# Patient Record
Sex: Male | Born: 1940
Health system: Southern US, Community
[De-identification: ages and names within clinical notes are randomized; demographics above are authoritative.]

## PROBLEM LIST (undated history)

## (undated) DIAGNOSIS — F17210 Nicotine dependence, cigarettes, uncomplicated: Secondary | ICD-10-CM

## (undated) DIAGNOSIS — E785 Hyperlipidemia, unspecified: Secondary | ICD-10-CM

## (undated) DIAGNOSIS — L600 Ingrowing nail: Secondary | ICD-10-CM

## (undated) DIAGNOSIS — I8390 Asymptomatic varicose veins of unspecified lower extremity: Secondary | ICD-10-CM

## (undated) DIAGNOSIS — R7301 Impaired fasting glucose: Secondary | ICD-10-CM

## (undated) DIAGNOSIS — R42 Dizziness and giddiness: Secondary | ICD-10-CM

## (undated) DIAGNOSIS — Z9889 Other specified postprocedural states: Secondary | ICD-10-CM

## (undated) DIAGNOSIS — Z9989 Dependence on other enabling machines and devices: Secondary | ICD-10-CM

## (undated) DIAGNOSIS — I499 Cardiac arrhythmia, unspecified: Secondary | ICD-10-CM

## (undated) DIAGNOSIS — M1990 Unspecified osteoarthritis, unspecified site: Secondary | ICD-10-CM

## (undated) DIAGNOSIS — G4733 Obstructive sleep apnea (adult) (pediatric): Secondary | ICD-10-CM

## (undated) DIAGNOSIS — R112 Nausea with vomiting, unspecified: Secondary | ICD-10-CM

## (undated) DIAGNOSIS — E782 Mixed hyperlipidemia: Secondary | ICD-10-CM

## (undated) DIAGNOSIS — I714 Abdominal aortic aneurysm, without rupture: Secondary | ICD-10-CM

## (undated) DIAGNOSIS — Z7901 Long term (current) use of anticoagulants: Secondary | ICD-10-CM

## (undated) DIAGNOSIS — I480 Paroxysmal atrial fibrillation: Secondary | ICD-10-CM

## (undated) DIAGNOSIS — H8390 Unspecified disease of inner ear, unspecified ear: Secondary | ICD-10-CM

## (undated) DIAGNOSIS — F419 Anxiety disorder, unspecified: Secondary | ICD-10-CM

## (undated) DIAGNOSIS — K219 Gastro-esophageal reflux disease without esophagitis: Secondary | ICD-10-CM

## (undated) DIAGNOSIS — I723 Aneurysm of iliac artery: Secondary | ICD-10-CM

## (undated) DIAGNOSIS — I517 Cardiomegaly: Secondary | ICD-10-CM

## (undated) DIAGNOSIS — Z96651 Presence of right artificial knee joint: Secondary | ICD-10-CM

## (undated) DIAGNOSIS — I8392 Asymptomatic varicose veins of left lower extremity: Secondary | ICD-10-CM

## (undated) DIAGNOSIS — M25561 Pain in right knee: Secondary | ICD-10-CM

## (undated) DIAGNOSIS — G4730 Sleep apnea, unspecified: Secondary | ICD-10-CM

## (undated) HISTORY — DX: Abdominal aortic aneurysm, without rupture: I71.4

## (undated) HISTORY — PX: COLONOSCOPY: SHX174

## (undated) HISTORY — DX: Cardiomegaly: I51.7

## (undated) HISTORY — PX: PILONIDAL CYST EXCISION: SHX744

## (undated) HISTORY — DX: Asymptomatic varicose veins of left lower extremity: I83.92

## (undated) HISTORY — DX: Aneurysm of iliac artery: I72.3

## (undated) HISTORY — DX: Impaired fasting glucose: R73.01

## (undated) HISTORY — DX: Pain in right knee: M25.561

## (undated) HISTORY — DX: Nicotine dependence, cigarettes, uncomplicated: F17.210

## (undated) HISTORY — PX: JOINT REPLACEMENT: SHX530

## (undated) HISTORY — DX: Ingrowing nail: L60.0

## (undated) HISTORY — DX: Paroxysmal atrial fibrillation: I48.0

## (undated) HISTORY — DX: Mixed hyperlipidemia: E78.2

## (undated) HISTORY — DX: Long term (current) use of anticoagulants: Z79.01

## (undated) HISTORY — DX: Presence of right artificial knee joint: Z96.651

---

## 1962-03-25 HISTORY — PX: TONSILLECTOMY: SUR1361

## 2011-03-27 DIAGNOSIS — G4739 Other sleep apnea: Secondary | ICD-10-CM | POA: Diagnosis not present

## 2011-03-27 DIAGNOSIS — I4891 Unspecified atrial fibrillation: Secondary | ICD-10-CM | POA: Diagnosis not present

## 2011-04-30 DIAGNOSIS — E782 Mixed hyperlipidemia: Secondary | ICD-10-CM | POA: Diagnosis not present

## 2011-04-30 DIAGNOSIS — F524 Premature ejaculation: Secondary | ICD-10-CM | POA: Diagnosis not present

## 2011-04-30 DIAGNOSIS — I4891 Unspecified atrial fibrillation: Secondary | ICD-10-CM | POA: Diagnosis not present

## 2011-05-06 DIAGNOSIS — H04129 Dry eye syndrome of unspecified lacrimal gland: Secondary | ICD-10-CM | POA: Diagnosis not present

## 2011-05-06 DIAGNOSIS — H2510 Age-related nuclear cataract, unspecified eye: Secondary | ICD-10-CM | POA: Diagnosis not present

## 2011-05-28 DIAGNOSIS — Z7901 Long term (current) use of anticoagulants: Secondary | ICD-10-CM | POA: Diagnosis not present

## 2011-05-28 DIAGNOSIS — I4891 Unspecified atrial fibrillation: Secondary | ICD-10-CM | POA: Diagnosis not present

## 2011-06-05 DIAGNOSIS — M5030 Other cervical disc degeneration, unspecified cervical region: Secondary | ICD-10-CM | POA: Diagnosis not present

## 2011-06-05 DIAGNOSIS — M542 Cervicalgia: Secondary | ICD-10-CM | POA: Diagnosis not present

## 2011-06-05 DIAGNOSIS — R0602 Shortness of breath: Secondary | ICD-10-CM | POA: Diagnosis not present

## 2011-06-05 DIAGNOSIS — M5412 Radiculopathy, cervical region: Secondary | ICD-10-CM | POA: Diagnosis not present

## 2011-06-05 DIAGNOSIS — M9981 Other biomechanical lesions of cervical region: Secondary | ICD-10-CM | POA: Diagnosis not present

## 2011-06-13 DIAGNOSIS — F17200 Nicotine dependence, unspecified, uncomplicated: Secondary | ICD-10-CM | POA: Diagnosis not present

## 2011-06-13 DIAGNOSIS — R0902 Hypoxemia: Secondary | ICD-10-CM | POA: Diagnosis not present

## 2011-06-13 DIAGNOSIS — Z1331 Encounter for screening for depression: Secondary | ICD-10-CM | POA: Diagnosis not present

## 2011-06-27 DIAGNOSIS — I4891 Unspecified atrial fibrillation: Secondary | ICD-10-CM | POA: Diagnosis not present

## 2011-07-01 DIAGNOSIS — Z0000 Encounter for general adult medical examination without abnormal findings: Secondary | ICD-10-CM | POA: Diagnosis not present

## 2011-07-05 DIAGNOSIS — H60509 Unspecified acute noninfective otitis externa, unspecified ear: Secondary | ICD-10-CM | POA: Diagnosis not present

## 2011-07-15 DIAGNOSIS — F17200 Nicotine dependence, unspecified, uncomplicated: Secondary | ICD-10-CM | POA: Diagnosis not present

## 2011-07-15 DIAGNOSIS — E669 Obesity, unspecified: Secondary | ICD-10-CM | POA: Diagnosis not present

## 2011-07-15 DIAGNOSIS — R413 Other amnesia: Secondary | ICD-10-CM | POA: Diagnosis not present

## 2011-07-30 DIAGNOSIS — I4891 Unspecified atrial fibrillation: Secondary | ICD-10-CM | POA: Diagnosis not present

## 2011-07-30 DIAGNOSIS — N529 Male erectile dysfunction, unspecified: Secondary | ICD-10-CM | POA: Diagnosis not present

## 2011-08-29 DIAGNOSIS — I4891 Unspecified atrial fibrillation: Secondary | ICD-10-CM | POA: Diagnosis not present

## 2011-09-06 DIAGNOSIS — I4891 Unspecified atrial fibrillation: Secondary | ICD-10-CM | POA: Diagnosis not present

## 2011-09-18 DIAGNOSIS — E785 Hyperlipidemia, unspecified: Secondary | ICD-10-CM | POA: Diagnosis not present

## 2011-09-18 DIAGNOSIS — I4891 Unspecified atrial fibrillation: Secondary | ICD-10-CM | POA: Diagnosis not present

## 2011-09-18 DIAGNOSIS — I517 Cardiomegaly: Secondary | ICD-10-CM | POA: Diagnosis not present

## 2011-09-18 DIAGNOSIS — R7301 Impaired fasting glucose: Secondary | ICD-10-CM | POA: Diagnosis not present

## 2011-09-18 DIAGNOSIS — G4733 Obstructive sleep apnea (adult) (pediatric): Secondary | ICD-10-CM | POA: Diagnosis not present

## 2011-09-18 DIAGNOSIS — F39 Unspecified mood [affective] disorder: Secondary | ICD-10-CM | POA: Diagnosis not present

## 2011-10-18 DIAGNOSIS — I4891 Unspecified atrial fibrillation: Secondary | ICD-10-CM | POA: Diagnosis not present

## 2011-11-20 DIAGNOSIS — I4891 Unspecified atrial fibrillation: Secondary | ICD-10-CM | POA: Diagnosis not present

## 2011-12-27 DIAGNOSIS — E782 Mixed hyperlipidemia: Secondary | ICD-10-CM | POA: Diagnosis not present

## 2011-12-27 DIAGNOSIS — J3089 Other allergic rhinitis: Secondary | ICD-10-CM | POA: Diagnosis not present

## 2011-12-27 DIAGNOSIS — I4891 Unspecified atrial fibrillation: Secondary | ICD-10-CM | POA: Diagnosis not present

## 2011-12-30 DIAGNOSIS — Z23 Encounter for immunization: Secondary | ICD-10-CM | POA: Diagnosis not present

## 2012-01-27 DIAGNOSIS — Z7901 Long term (current) use of anticoagulants: Secondary | ICD-10-CM | POA: Diagnosis not present

## 2012-01-27 DIAGNOSIS — I4891 Unspecified atrial fibrillation: Secondary | ICD-10-CM | POA: Diagnosis not present

## 2012-02-26 DIAGNOSIS — Z7901 Long term (current) use of anticoagulants: Secondary | ICD-10-CM | POA: Diagnosis not present

## 2012-02-26 DIAGNOSIS — I4891 Unspecified atrial fibrillation: Secondary | ICD-10-CM | POA: Diagnosis not present

## 2012-03-13 DIAGNOSIS — L02519 Cutaneous abscess of unspecified hand: Secondary | ICD-10-CM | POA: Diagnosis not present

## 2012-03-16 DIAGNOSIS — L02519 Cutaneous abscess of unspecified hand: Secondary | ICD-10-CM | POA: Diagnosis not present

## 2012-03-16 DIAGNOSIS — L03119 Cellulitis of unspecified part of limb: Secondary | ICD-10-CM | POA: Diagnosis not present

## 2012-04-01 DIAGNOSIS — I4891 Unspecified atrial fibrillation: Secondary | ICD-10-CM | POA: Diagnosis not present

## 2012-04-01 DIAGNOSIS — Z1211 Encounter for screening for malignant neoplasm of colon: Secondary | ICD-10-CM | POA: Diagnosis not present

## 2012-04-01 DIAGNOSIS — Z125 Encounter for screening for malignant neoplasm of prostate: Secondary | ICD-10-CM | POA: Diagnosis not present

## 2012-04-01 DIAGNOSIS — Z13220 Encounter for screening for lipoid disorders: Secondary | ICD-10-CM | POA: Diagnosis not present

## 2012-04-01 DIAGNOSIS — Z7901 Long term (current) use of anticoagulants: Secondary | ICD-10-CM | POA: Diagnosis not present

## 2012-04-01 DIAGNOSIS — Z131 Encounter for screening for diabetes mellitus: Secondary | ICD-10-CM | POA: Diagnosis not present

## 2012-04-28 DIAGNOSIS — Z7901 Long term (current) use of anticoagulants: Secondary | ICD-10-CM | POA: Diagnosis not present

## 2012-04-28 DIAGNOSIS — G4733 Obstructive sleep apnea (adult) (pediatric): Secondary | ICD-10-CM | POA: Diagnosis not present

## 2012-04-28 DIAGNOSIS — I4891 Unspecified atrial fibrillation: Secondary | ICD-10-CM | POA: Diagnosis not present

## 2012-05-04 DIAGNOSIS — I4891 Unspecified atrial fibrillation: Secondary | ICD-10-CM | POA: Diagnosis not present

## 2012-05-04 DIAGNOSIS — Z7901 Long term (current) use of anticoagulants: Secondary | ICD-10-CM | POA: Diagnosis not present

## 2012-05-04 DIAGNOSIS — E782 Mixed hyperlipidemia: Secondary | ICD-10-CM | POA: Diagnosis not present

## 2012-05-11 DIAGNOSIS — I714 Abdominal aortic aneurysm, without rupture: Secondary | ICD-10-CM | POA: Diagnosis not present

## 2012-05-28 DIAGNOSIS — I4891 Unspecified atrial fibrillation: Secondary | ICD-10-CM | POA: Diagnosis not present

## 2012-06-22 DIAGNOSIS — I4891 Unspecified atrial fibrillation: Secondary | ICD-10-CM | POA: Diagnosis not present

## 2012-07-07 DIAGNOSIS — I8000 Phlebitis and thrombophlebitis of superficial vessels of unspecified lower extremity: Secondary | ICD-10-CM | POA: Diagnosis not present

## 2012-07-22 DIAGNOSIS — I83893 Varicose veins of bilateral lower extremities with other complications: Secondary | ICD-10-CM | POA: Diagnosis not present

## 2012-07-27 DIAGNOSIS — I4891 Unspecified atrial fibrillation: Secondary | ICD-10-CM | POA: Diagnosis not present

## 2012-08-06 DIAGNOSIS — I4891 Unspecified atrial fibrillation: Secondary | ICD-10-CM | POA: Diagnosis not present

## 2012-09-08 DIAGNOSIS — I4891 Unspecified atrial fibrillation: Secondary | ICD-10-CM | POA: Diagnosis not present

## 2012-09-08 DIAGNOSIS — E782 Mixed hyperlipidemia: Secondary | ICD-10-CM | POA: Diagnosis not present

## 2012-09-08 DIAGNOSIS — F39 Unspecified mood [affective] disorder: Secondary | ICD-10-CM | POA: Diagnosis not present

## 2012-09-11 DIAGNOSIS — I4891 Unspecified atrial fibrillation: Secondary | ICD-10-CM | POA: Diagnosis not present

## 2012-10-12 DIAGNOSIS — I4891 Unspecified atrial fibrillation: Secondary | ICD-10-CM | POA: Diagnosis not present

## 2012-11-12 DIAGNOSIS — I4891 Unspecified atrial fibrillation: Secondary | ICD-10-CM | POA: Diagnosis not present

## 2012-11-23 DIAGNOSIS — J189 Pneumonia, unspecified organism: Secondary | ICD-10-CM | POA: Diagnosis not present

## 2012-12-01 DIAGNOSIS — I714 Abdominal aortic aneurysm, without rupture: Secondary | ICD-10-CM | POA: Diagnosis not present

## 2012-12-16 DIAGNOSIS — I4891 Unspecified atrial fibrillation: Secondary | ICD-10-CM | POA: Diagnosis not present

## 2013-01-15 DIAGNOSIS — I4891 Unspecified atrial fibrillation: Secondary | ICD-10-CM | POA: Diagnosis not present

## 2013-01-15 DIAGNOSIS — Z23 Encounter for immunization: Secondary | ICD-10-CM | POA: Diagnosis not present

## 2013-02-05 DIAGNOSIS — Z23 Encounter for immunization: Secondary | ICD-10-CM | POA: Diagnosis not present

## 2013-02-05 DIAGNOSIS — I4891 Unspecified atrial fibrillation: Secondary | ICD-10-CM | POA: Diagnosis not present

## 2013-02-24 DIAGNOSIS — M5030 Other cervical disc degeneration, unspecified cervical region: Secondary | ICD-10-CM | POA: Diagnosis not present

## 2013-02-24 DIAGNOSIS — M5412 Radiculopathy, cervical region: Secondary | ICD-10-CM | POA: Diagnosis not present

## 2013-02-24 DIAGNOSIS — M542 Cervicalgia: Secondary | ICD-10-CM | POA: Diagnosis not present

## 2013-02-24 DIAGNOSIS — M9981 Other biomechanical lesions of cervical region: Secondary | ICD-10-CM | POA: Diagnosis not present

## 2013-03-10 DIAGNOSIS — I4891 Unspecified atrial fibrillation: Secondary | ICD-10-CM | POA: Diagnosis not present

## 2013-03-12 DIAGNOSIS — I4891 Unspecified atrial fibrillation: Secondary | ICD-10-CM | POA: Diagnosis not present

## 2013-03-15 DIAGNOSIS — I4891 Unspecified atrial fibrillation: Secondary | ICD-10-CM | POA: Diagnosis not present

## 2013-03-22 DIAGNOSIS — M542 Cervicalgia: Secondary | ICD-10-CM | POA: Diagnosis not present

## 2013-03-22 DIAGNOSIS — M5412 Radiculopathy, cervical region: Secondary | ICD-10-CM | POA: Diagnosis not present

## 2013-03-22 DIAGNOSIS — M5030 Other cervical disc degeneration, unspecified cervical region: Secondary | ICD-10-CM | POA: Diagnosis not present

## 2013-03-22 DIAGNOSIS — I4891 Unspecified atrial fibrillation: Secondary | ICD-10-CM | POA: Diagnosis not present

## 2013-03-22 DIAGNOSIS — M9981 Other biomechanical lesions of cervical region: Secondary | ICD-10-CM | POA: Diagnosis not present

## 2013-03-25 HISTORY — PX: CATARACT EXTRACTION W/ INTRAOCULAR LENS  IMPLANT, BILATERAL: SHX1307

## 2013-03-29 DIAGNOSIS — I4891 Unspecified atrial fibrillation: Secondary | ICD-10-CM | POA: Diagnosis not present

## 2013-04-12 DIAGNOSIS — I4891 Unspecified atrial fibrillation: Secondary | ICD-10-CM | POA: Diagnosis not present

## 2013-05-13 DIAGNOSIS — I4891 Unspecified atrial fibrillation: Secondary | ICD-10-CM | POA: Diagnosis not present

## 2013-06-03 DIAGNOSIS — E782 Mixed hyperlipidemia: Secondary | ICD-10-CM | POA: Diagnosis not present

## 2013-06-04 DIAGNOSIS — M5412 Radiculopathy, cervical region: Secondary | ICD-10-CM | POA: Diagnosis not present

## 2013-06-04 DIAGNOSIS — M9981 Other biomechanical lesions of cervical region: Secondary | ICD-10-CM | POA: Diagnosis not present

## 2013-06-04 DIAGNOSIS — M5030 Other cervical disc degeneration, unspecified cervical region: Secondary | ICD-10-CM | POA: Diagnosis not present

## 2013-06-04 DIAGNOSIS — M542 Cervicalgia: Secondary | ICD-10-CM | POA: Diagnosis not present

## 2013-06-10 DIAGNOSIS — Z7901 Long term (current) use of anticoagulants: Secondary | ICD-10-CM | POA: Diagnosis not present

## 2013-06-10 DIAGNOSIS — I4891 Unspecified atrial fibrillation: Secondary | ICD-10-CM | POA: Diagnosis not present

## 2013-06-10 DIAGNOSIS — Z6836 Body mass index (BMI) 36.0-36.9, adult: Secondary | ICD-10-CM | POA: Diagnosis not present

## 2013-06-10 DIAGNOSIS — F17200 Nicotine dependence, unspecified, uncomplicated: Secondary | ICD-10-CM | POA: Diagnosis not present

## 2013-06-10 DIAGNOSIS — E782 Mixed hyperlipidemia: Secondary | ICD-10-CM | POA: Diagnosis not present

## 2013-06-24 DIAGNOSIS — H11159 Pinguecula, unspecified eye: Secondary | ICD-10-CM | POA: Diagnosis not present

## 2013-06-24 DIAGNOSIS — Z7901 Long term (current) use of anticoagulants: Secondary | ICD-10-CM | POA: Diagnosis not present

## 2013-06-24 DIAGNOSIS — H2589 Other age-related cataract: Secondary | ICD-10-CM | POA: Diagnosis not present

## 2013-06-24 DIAGNOSIS — I4891 Unspecified atrial fibrillation: Secondary | ICD-10-CM | POA: Diagnosis not present

## 2013-07-07 DIAGNOSIS — H2589 Other age-related cataract: Secondary | ICD-10-CM | POA: Diagnosis not present

## 2013-07-07 DIAGNOSIS — H40019 Open angle with borderline findings, low risk, unspecified eye: Secondary | ICD-10-CM | POA: Diagnosis not present

## 2013-07-07 DIAGNOSIS — H2510 Age-related nuclear cataract, unspecified eye: Secondary | ICD-10-CM | POA: Diagnosis not present

## 2013-07-07 DIAGNOSIS — H52209 Unspecified astigmatism, unspecified eye: Secondary | ICD-10-CM | POA: Diagnosis not present

## 2013-07-29 DIAGNOSIS — Z7901 Long term (current) use of anticoagulants: Secondary | ICD-10-CM | POA: Diagnosis not present

## 2013-07-29 DIAGNOSIS — I4891 Unspecified atrial fibrillation: Secondary | ICD-10-CM | POA: Diagnosis not present

## 2013-08-04 DIAGNOSIS — H2510 Age-related nuclear cataract, unspecified eye: Secondary | ICD-10-CM | POA: Diagnosis not present

## 2013-08-04 DIAGNOSIS — H52209 Unspecified astigmatism, unspecified eye: Secondary | ICD-10-CM | POA: Diagnosis not present

## 2013-08-04 DIAGNOSIS — H2589 Other age-related cataract: Secondary | ICD-10-CM | POA: Diagnosis not present

## 2013-08-30 DIAGNOSIS — Z7901 Long term (current) use of anticoagulants: Secondary | ICD-10-CM | POA: Diagnosis not present

## 2013-08-30 DIAGNOSIS — I4891 Unspecified atrial fibrillation: Secondary | ICD-10-CM | POA: Diagnosis not present

## 2013-09-27 DIAGNOSIS — Z139 Encounter for screening, unspecified: Secondary | ICD-10-CM | POA: Diagnosis not present

## 2013-09-27 DIAGNOSIS — Z7901 Long term (current) use of anticoagulants: Secondary | ICD-10-CM | POA: Diagnosis not present

## 2013-09-27 DIAGNOSIS — H8110 Benign paroxysmal vertigo, unspecified ear: Secondary | ICD-10-CM | POA: Diagnosis not present

## 2013-09-27 DIAGNOSIS — I4891 Unspecified atrial fibrillation: Secondary | ICD-10-CM | POA: Diagnosis not present

## 2013-09-27 DIAGNOSIS — F17200 Nicotine dependence, unspecified, uncomplicated: Secondary | ICD-10-CM | POA: Diagnosis not present

## 2013-10-29 DIAGNOSIS — I4891 Unspecified atrial fibrillation: Secondary | ICD-10-CM | POA: Diagnosis not present

## 2013-11-30 DIAGNOSIS — Z7901 Long term (current) use of anticoagulants: Secondary | ICD-10-CM | POA: Diagnosis not present

## 2013-11-30 DIAGNOSIS — I4891 Unspecified atrial fibrillation: Secondary | ICD-10-CM | POA: Diagnosis not present

## 2013-12-13 DIAGNOSIS — I4891 Unspecified atrial fibrillation: Secondary | ICD-10-CM | POA: Diagnosis not present

## 2013-12-13 DIAGNOSIS — Z7901 Long term (current) use of anticoagulants: Secondary | ICD-10-CM | POA: Diagnosis not present

## 2013-12-13 DIAGNOSIS — I714 Abdominal aortic aneurysm, without rupture, unspecified: Secondary | ICD-10-CM | POA: Diagnosis not present

## 2013-12-30 DIAGNOSIS — Z23 Encounter for immunization: Secondary | ICD-10-CM | POA: Diagnosis not present

## 2013-12-30 DIAGNOSIS — I482 Chronic atrial fibrillation: Secondary | ICD-10-CM | POA: Diagnosis not present

## 2014-01-31 DIAGNOSIS — Z7901 Long term (current) use of anticoagulants: Secondary | ICD-10-CM | POA: Diagnosis not present

## 2014-01-31 DIAGNOSIS — I482 Chronic atrial fibrillation: Secondary | ICD-10-CM | POA: Diagnosis not present

## 2014-02-10 DIAGNOSIS — Z139 Encounter for screening, unspecified: Secondary | ICD-10-CM | POA: Diagnosis not present

## 2014-02-10 DIAGNOSIS — Z0000 Encounter for general adult medical examination without abnormal findings: Secondary | ICD-10-CM | POA: Diagnosis not present

## 2014-02-10 DIAGNOSIS — Z125 Encounter for screening for malignant neoplasm of prostate: Secondary | ICD-10-CM | POA: Diagnosis not present

## 2014-02-10 DIAGNOSIS — Z1389 Encounter for screening for other disorder: Secondary | ICD-10-CM | POA: Diagnosis not present

## 2014-02-14 DIAGNOSIS — R7301 Impaired fasting glucose: Secondary | ICD-10-CM | POA: Diagnosis not present

## 2014-02-14 DIAGNOSIS — E782 Mixed hyperlipidemia: Secondary | ICD-10-CM | POA: Diagnosis not present

## 2014-02-28 DIAGNOSIS — M546 Pain in thoracic spine: Secondary | ICD-10-CM | POA: Diagnosis not present

## 2014-02-28 DIAGNOSIS — M5136 Other intervertebral disc degeneration, lumbar region: Secondary | ICD-10-CM | POA: Diagnosis not present

## 2014-02-28 DIAGNOSIS — M9903 Segmental and somatic dysfunction of lumbar region: Secondary | ICD-10-CM | POA: Diagnosis not present

## 2014-02-28 DIAGNOSIS — M5137 Other intervertebral disc degeneration, lumbosacral region: Secondary | ICD-10-CM | POA: Diagnosis not present

## 2014-02-28 DIAGNOSIS — M5442 Lumbago with sciatica, left side: Secondary | ICD-10-CM | POA: Diagnosis not present

## 2014-02-28 DIAGNOSIS — M9904 Segmental and somatic dysfunction of sacral region: Secondary | ICD-10-CM | POA: Diagnosis not present

## 2014-03-02 DIAGNOSIS — I482 Chronic atrial fibrillation: Secondary | ICD-10-CM | POA: Diagnosis not present

## 2014-03-02 DIAGNOSIS — R7301 Impaired fasting glucose: Secondary | ICD-10-CM | POA: Diagnosis not present

## 2014-03-02 DIAGNOSIS — M25561 Pain in right knee: Secondary | ICD-10-CM | POA: Diagnosis not present

## 2014-03-02 DIAGNOSIS — E782 Mixed hyperlipidemia: Secondary | ICD-10-CM | POA: Diagnosis not present

## 2014-03-02 DIAGNOSIS — M1711 Unilateral primary osteoarthritis, right knee: Secondary | ICD-10-CM | POA: Diagnosis not present

## 2014-03-02 DIAGNOSIS — G8929 Other chronic pain: Secondary | ICD-10-CM | POA: Diagnosis not present

## 2014-03-15 DIAGNOSIS — Z6836 Body mass index (BMI) 36.0-36.9, adult: Secondary | ICD-10-CM | POA: Diagnosis not present

## 2014-03-15 DIAGNOSIS — M179 Osteoarthritis of knee, unspecified: Secondary | ICD-10-CM | POA: Diagnosis not present

## 2014-04-04 DIAGNOSIS — I482 Chronic atrial fibrillation: Secondary | ICD-10-CM | POA: Diagnosis not present

## 2014-04-07 DIAGNOSIS — M25561 Pain in right knee: Secondary | ICD-10-CM

## 2014-04-07 DIAGNOSIS — M1711 Unilateral primary osteoarthritis, right knee: Secondary | ICD-10-CM | POA: Diagnosis not present

## 2014-04-07 HISTORY — DX: Pain in right knee: M25.561

## 2014-04-14 DIAGNOSIS — F39 Unspecified mood [affective] disorder: Secondary | ICD-10-CM | POA: Diagnosis not present

## 2014-04-14 DIAGNOSIS — I482 Chronic atrial fibrillation: Secondary | ICD-10-CM | POA: Diagnosis not present

## 2014-04-14 DIAGNOSIS — I714 Abdominal aortic aneurysm, without rupture: Secondary | ICD-10-CM | POA: Diagnosis not present

## 2014-04-14 DIAGNOSIS — M179 Osteoarthritis of knee, unspecified: Secondary | ICD-10-CM | POA: Diagnosis not present

## 2014-04-22 DIAGNOSIS — G4733 Obstructive sleep apnea (adult) (pediatric): Secondary | ICD-10-CM | POA: Diagnosis not present

## 2014-04-22 DIAGNOSIS — E785 Hyperlipidemia, unspecified: Secondary | ICD-10-CM | POA: Diagnosis not present

## 2014-04-22 DIAGNOSIS — Z7901 Long term (current) use of anticoagulants: Secondary | ICD-10-CM | POA: Diagnosis not present

## 2014-04-22 DIAGNOSIS — I480 Paroxysmal atrial fibrillation: Secondary | ICD-10-CM | POA: Diagnosis not present

## 2014-04-25 ENCOUNTER — Other Ambulatory Visit: Payer: Self-pay | Admitting: Orthopedic Surgery

## 2014-04-28 NOTE — Pre-Procedure Instructions (Signed)
Jeffrey Hammond  04/28/2014   Your procedure is scheduled on:  Monday, February 15.  Report to [facility] Admitting at 8:45AM.  Call this number if you have problems the morning of surgery: [phone]               For any other questions, please call [phone], Monday - Friday 8 AM - 4 PM.  Remember:   Do not eat food or drink liquids after midnight Sunday, February 14.   Take these medicines the morning of surgery with A SIP OF WATER: -   Do not wear jewelry, make-up or nail polish.  Do not wear lotions, powders, or perfumes.  Men may shave face and neck.  Do not bring valuables to the hospital.              [facility] is not responsible  for any belongings or valuables.               Contacts, dentures or bridgework may not be worn into surgery.  Leave suitcase in the car. After surgery it may be brought to your room.  For patients admitted to the hospital, discharge time is determined by your treatment team.                 Special Instructions: Review  [place] - Preparing For Surgery.   Please read over the following fact sheets that you were given: Pain Booklet, Coughing and Deep Breathing and Surgical Site Infection Prevention  and Incentive Spirometery.

## 2014-04-29 ENCOUNTER — Encounter (HOSPITAL_COMMUNITY): Payer: Self-pay

## 2014-04-29 ENCOUNTER — Encounter (HOSPITAL_COMMUNITY)
Admission: RE | Admit: 2014-04-29 | Discharge: 2014-04-29 | Disposition: A | Discharge status: Home / Self Care | Payer: Medicare Other | Source: Ambulatory Visit | Attending: Orthopedic Surgery | Admitting: Orthopedic Surgery

## 2014-04-29 DIAGNOSIS — Z01818 Encounter for other preprocedural examination: Secondary | ICD-10-CM | POA: Insufficient documentation

## 2014-04-29 DIAGNOSIS — J449 Chronic obstructive pulmonary disease, unspecified: Secondary | ICD-10-CM | POA: Diagnosis not present

## 2014-04-29 DIAGNOSIS — K219 Gastro-esophageal reflux disease without esophagitis: Secondary | ICD-10-CM | POA: Insufficient documentation

## 2014-04-29 DIAGNOSIS — Z01812 Encounter for preprocedural laboratory examination: Secondary | ICD-10-CM | POA: Diagnosis not present

## 2014-04-29 DIAGNOSIS — M1711 Unilateral primary osteoarthritis, right knee: Secondary | ICD-10-CM | POA: Diagnosis not present

## 2014-04-29 DIAGNOSIS — I499 Cardiac arrhythmia, unspecified: Secondary | ICD-10-CM | POA: Insufficient documentation

## 2014-04-29 DIAGNOSIS — Z7901 Long term (current) use of anticoagulants: Secondary | ICD-10-CM | POA: Diagnosis not present

## 2014-04-29 DIAGNOSIS — M5134 Other intervertebral disc degeneration, thoracic region: Secondary | ICD-10-CM | POA: Insufficient documentation

## 2014-04-29 HISTORY — DX: Dizziness and giddiness: R42

## 2014-04-29 HISTORY — DX: Sleep apnea, unspecified: G47.30

## 2014-04-29 HISTORY — DX: Hyperlipidemia, unspecified: E78.5

## 2014-04-29 HISTORY — DX: Asymptomatic varicose veins of unspecified lower extremity: I83.90

## 2014-04-29 HISTORY — DX: Anxiety disorder, unspecified: F41.9

## 2014-04-29 HISTORY — DX: Other specified postprocedural states: Z98.890

## 2014-04-29 HISTORY — DX: Nausea with vomiting, unspecified: R11.2

## 2014-04-29 HISTORY — DX: Gastro-esophageal reflux disease without esophagitis: K21.9

## 2014-04-29 HISTORY — DX: Unspecified osteoarthritis, unspecified site: M19.90

## 2014-04-29 HISTORY — DX: Unspecified disease of inner ear, unspecified ear: H83.90

## 2014-04-29 HISTORY — DX: Cardiac arrhythmia, unspecified: I49.9

## 2014-04-29 LAB — CBC WITH DIFFERENTIAL/PLATELET
Basophils Absolute: 0 10*3/uL (ref 0.0–0.1)
Basophils Relative: 1 % (ref 0–1)
Eosinophils Absolute: 0.2 10*3/uL (ref 0.0–0.7)
Eosinophils Relative: 3 % (ref 0–5)
HCT: 44.1 % (ref 39.0–52.0)
HEMOGLOBIN: 15.1 g/dL (ref 13.0–17.0)
LYMPHS ABS: 3.2 10*3/uL (ref 0.7–4.0)
Lymphocytes Relative: 50 % — ABNORMAL HIGH (ref 12–46)
MCH: 30.8 pg (ref 26.0–34.0)
MCHC: 34.2 g/dL (ref 30.0–36.0)
MCV: 89.8 fL (ref 78.0–100.0)
MONO ABS: 0.6 10*3/uL (ref 0.1–1.0)
MONOS PCT: 10 % (ref 3–12)
NEUTROS PCT: 36 % — AB (ref 43–77)
Neutro Abs: 2.2 10*3/uL (ref 1.7–7.7)
Platelets: 213 10*3/uL (ref 150–400)
RBC: 4.91 MIL/uL (ref 4.22–5.81)
RDW: 12.9 % (ref 11.5–15.5)
WBC: 6.2 10*3/uL (ref 4.0–10.5)

## 2014-04-29 LAB — URINALYSIS, ROUTINE W REFLEX MICROSCOPIC
Bilirubin Urine: NEGATIVE
GLUCOSE, UA: NEGATIVE mg/dL
Ketones, ur: 15 mg/dL — AB
LEUKOCYTES UA: NEGATIVE
NITRITE: NEGATIVE
Protein, ur: NEGATIVE mg/dL
SPECIFIC GRAVITY, URINE: 1.018 (ref 1.005–1.030)
Urobilinogen, UA: 0.2 mg/dL (ref 0.0–1.0)
pH: 5.5 (ref 5.0–8.0)

## 2014-04-29 LAB — SURGICAL PCR SCREEN
MRSA, PCR: NEGATIVE
Staphylococcus aureus: NEGATIVE

## 2014-04-29 LAB — COMPREHENSIVE METABOLIC PANEL
ALK PHOS: 67 U/L (ref 39–117)
ALT: 22 U/L (ref 0–53)
ANION GAP: 7 (ref 5–15)
AST: 24 U/L (ref 0–37)
Albumin: 4.3 g/dL (ref 3.5–5.2)
BILIRUBIN TOTAL: 0.9 mg/dL (ref 0.3–1.2)
BUN: 18 mg/dL (ref 6–23)
CALCIUM: 9.6 mg/dL (ref 8.4–10.5)
CHLORIDE: 105 mmol/L (ref 96–112)
CO2: 27 mmol/L (ref 19–32)
Creatinine, Ser: 0.98 mg/dL (ref 0.50–1.35)
GFR calc Af Amer: >90 mL/min (ref >90)
GFR, EST NON AFRICAN AMERICAN: 80 mL/min — AB (ref >90)
Glucose, Bld: 97 mg/dL (ref 70–99)
Potassium: 4.5 mmol/L (ref 3.5–5.1)
Sodium: 139 mmol/L (ref 135–145)
TOTAL PROTEIN: 7.1 g/dL (ref 6.0–8.3)

## 2014-04-29 LAB — APTT: aPTT: 37 seconds (ref 24–37)

## 2014-04-29 LAB — URINE MICROSCOPIC-ADD ON

## 2014-04-29 NOTE — Progress Notes (Signed)
Mr [facility] cardiologist is Dr Bettina Gavia at [facility] in [place].  Patient went to Dr Bettina Gavia for cardiac clearance, I faxes a request to office for EKG, last office note and sleep study (Dr Bettina Gavia sent patient for sleep study, patient does not remember were he had it.)  Mr Lack is to stop Coumadin Sunday, February 7.  Pt/INR will be drawn the day of surgery.

## 2014-04-29 NOTE — Pre-Procedure Instructions (Addendum)
Jeffrey Hammond  04/29/2014   Your procedure is scheduled on:  Monday, February 15.  Report to [facility] Admitting at 6:45AM.  Call this number if you have problems the morning of surgery: [phone]               For any other questions, please call [phone], Monday - Friday 8 AM - 4 PM.  Remember:   Do not eat food or drink liquids after midnight Sunday, February 14.   Take these medicines the morning of surgery with A SIP OF WATER: meclizine (ANTIVERT).             Take if needed acetaminophen (TYLENOL).                 Stop Coumadin and Fish Oil on Sunday, February 7.   Do not wear jewelry, make-up or nail polish.  Do not wear lotions, powders, or perfumes.  Men may shave face and neck.  Do not bring valuables to the hospital.              [facility] is not responsible  for any belongings or valuables.               Contacts, dentures or bridgework may not be worn into surgery.  Leave suitcase in the car. After surgery it may be brought to your room.  For patients admitted to the hospital, discharge time is determined by your treatment team.                 Special Instructions: Review  [place] - Preparing For Surgery.   Please read over the following fact sheets that you were given: Pain Booklet, Coughing and Deep Breathing and Surgical Site Infection Prevention  and Incentive Spirometery.

## 2014-04-30 LAB — URINE CULTURE
CULTURE: NO GROWTH
Colony Count: NO GROWTH

## 2014-05-02 DIAGNOSIS — M1711 Unilateral primary osteoarthritis, right knee: Secondary | ICD-10-CM | POA: Diagnosis not present

## 2014-05-03 NOTE — Progress Notes (Signed)
Call placed to Dr [facility] office [facility]) office to fax over note, for cardiac clearance.

## 2014-05-08 MED ORDER — CEFAZOLIN SODIUM-DEXTROSE 2-3 GM-% IV SOLR
2.0000 g | INTRAVENOUS | Status: AC
  Administered 2014-05-09: 2 g via INTRAVENOUS
  Filled 2014-05-08: qty 50

## 2014-05-08 MED ORDER — TRANEXAMIC ACID 100 MG/ML IV SOLN
2000.0000 mg | INTRAVENOUS | Status: DC
  Filled 2014-05-08 (×2): qty 20

## 2014-05-08 NOTE — Anesthesia Preprocedure Evaluation (Addendum)
Anesthesia Evaluation  Patient identified by MRN, date of birth, ID band Patient awake    Reviewed: Allergy & Precautions, NPO status , Patient's Chart, lab work & pertinent test results, reviewed documented beta blocker date and time   History of Anesthesia Complications (+) PONV  Airway Mallampati: II   Neck ROM: Full    Dental  (+) Teeth Intact, Dental Advisory Given   Pulmonary sleep apnea , Current Smoker (55 pack year),  breath sounds clear to auscultation        Cardiovascular + dysrhythmias     Neuro/Psych Anxiety    GI/Hepatic Neg liver ROS, GERD-  Medicated,  Endo/Other  negative endocrine ROS  Renal/GU negative Renal ROS     Musculoskeletal   Abdominal (+) + obese,   Peds  Hematology 15/44   Anesthesia Other Findings   Reproductive/Obstetrics                            Anesthesia Physical Anesthesia Plan  ASA: III  Anesthesia Plan: General   Post-op Pain Management: MAC Combined w/ Regional for Post-op pain   Induction: Intravenous  Airway Management Planned:   Additional Equipment:   Intra-op Plan:   Post-operative Plan: Extubation in OR  Informed Consent: I have reviewed the patients History and Physical, chart, labs and discussed the procedure including the risks, benefits and alternatives for the proposed anesthesia with the patient or authorized representative who has indicated his/her understanding and acceptance.     Plan Discussed with:   Anesthesia Plan Comments:         Anesthesia Quick Evaluation

## 2014-05-09 ENCOUNTER — Inpatient Hospital Stay (HOSPITAL_COMMUNITY): Payer: Medicare Other | Admitting: Anesthesiology

## 2014-05-09 ENCOUNTER — Encounter (HOSPITAL_COMMUNITY): Payer: Self-pay | Admitting: *Deleted

## 2014-05-09 ENCOUNTER — Inpatient Hospital Stay (HOSPITAL_COMMUNITY)
Admission: RE | Admit: 2014-05-09 | Discharge: 2014-05-10 | DRG: 470 | Disposition: A | Discharge status: Home Health | Payer: Medicare Other | Source: Ambulatory Visit | Attending: Orthopedic Surgery | Admitting: Orthopedic Surgery

## 2014-05-09 ENCOUNTER — Encounter (HOSPITAL_COMMUNITY)
Admission: RE | Disposition: A | Discharge status: Home Health | Payer: Self-pay | Source: Ambulatory Visit | Attending: Orthopedic Surgery

## 2014-05-09 DIAGNOSIS — Z7901 Long term (current) use of anticoagulants: Secondary | ICD-10-CM | POA: Diagnosis not present

## 2014-05-09 DIAGNOSIS — D62 Acute posthemorrhagic anemia: Secondary | ICD-10-CM | POA: Diagnosis not present

## 2014-05-09 DIAGNOSIS — F17210 Nicotine dependence, cigarettes, uncomplicated: Secondary | ICD-10-CM | POA: Diagnosis present

## 2014-05-09 DIAGNOSIS — M1711 Unilateral primary osteoarthritis, right knee: Secondary | ICD-10-CM | POA: Diagnosis not present

## 2014-05-09 DIAGNOSIS — Z96651 Presence of right artificial knee joint: Secondary | ICD-10-CM

## 2014-05-09 DIAGNOSIS — E785 Hyperlipidemia, unspecified: Secondary | ICD-10-CM | POA: Diagnosis present

## 2014-05-09 DIAGNOSIS — G4733 Obstructive sleep apnea (adult) (pediatric): Secondary | ICD-10-CM | POA: Diagnosis present

## 2014-05-09 DIAGNOSIS — K219 Gastro-esophageal reflux disease without esophagitis: Secondary | ICD-10-CM | POA: Diagnosis present

## 2014-05-09 DIAGNOSIS — Z79899 Other long term (current) drug therapy: Secondary | ICD-10-CM

## 2014-05-09 DIAGNOSIS — G8918 Other acute postprocedural pain: Secondary | ICD-10-CM | POA: Diagnosis not present

## 2014-05-09 DIAGNOSIS — M25561 Pain in right knee: Secondary | ICD-10-CM | POA: Diagnosis not present

## 2014-05-09 DIAGNOSIS — M179 Osteoarthritis of knee, unspecified: Secondary | ICD-10-CM | POA: Diagnosis not present

## 2014-05-09 HISTORY — DX: Dependence on other enabling machines and devices: Z99.89

## 2014-05-09 HISTORY — PX: TOTAL KNEE ARTHROPLASTY: SHX125

## 2014-05-09 HISTORY — DX: Obstructive sleep apnea (adult) (pediatric): G47.33

## 2014-05-09 HISTORY — DX: Presence of right artificial knee joint: Z96.651

## 2014-05-09 LAB — PROTIME-INR
INR: 1.02 (ref 0.00–1.49)
PROTHROMBIN TIME: 13.5 s (ref 11.6–15.2)

## 2014-05-09 LAB — CBC
HCT: 38.7 % — ABNORMAL LOW (ref 39.0–52.0)
HEMOGLOBIN: 13 g/dL (ref 13.0–17.0)
MCH: 30.6 pg (ref 26.0–34.0)
MCHC: 33.6 g/dL (ref 30.0–36.0)
MCV: 91.1 fL (ref 78.0–100.0)
Platelets: 167 10*3/uL (ref 150–400)
RBC: 4.25 MIL/uL (ref 4.22–5.81)
RDW: 13.1 % (ref 11.5–15.5)
WBC: 7.2 10*3/uL (ref 4.0–10.5)

## 2014-05-09 LAB — CREATININE, SERUM
Creatinine, Ser: 0.88 mg/dL (ref 0.50–1.35)
GFR calc Af Amer: >90 mL/min (ref >90)
GFR, EST NON AFRICAN AMERICAN: 84 mL/min — AB (ref >90)

## 2014-05-09 SURGERY — ARTHROPLASTY, KNEE, TOTAL
Anesthesia: General | Site: Knee | Laterality: Right

## 2014-05-09 MED ORDER — SIMETHICONE 80 MG PO CHEW
80.0000 mg | CHEWABLE_TABLET | Freq: Four times a day (QID) | ORAL | Status: DC | PRN
  Filled 2014-05-09: qty 1

## 2014-05-09 MED ORDER — SUCCINYLCHOLINE CHLORIDE 20 MG/ML IJ SOLN
INTRAMUSCULAR | Status: AC
  Filled 2014-05-09: qty 1

## 2014-05-09 MED ORDER — PROPOFOL 10 MG/ML IV BOLUS
INTRAVENOUS | Status: AC
  Filled 2014-05-09: qty 20

## 2014-05-09 MED ORDER — METHOCARBAMOL 500 MG PO TABS: 500.0000 mg | ORAL_TABLET | Freq: Four times a day (QID) | ORAL | Status: DC | PRN

## 2014-05-09 MED ORDER — BUPIVACAINE-EPINEPHRINE 0.5% -1:200000 IJ SOLN
INTRAMUSCULAR | Status: DC | PRN
  Administered 2014-05-09: 30 mL

## 2014-05-09 MED ORDER — HYDROMORPHONE HCL 1 MG/ML IJ SOLN: 1.0000 mg | INTRAMUSCULAR | Status: DC | PRN

## 2014-05-09 MED ORDER — METHOCARBAMOL 1000 MG/10ML IJ SOLN
500.0000 mg | Freq: Four times a day (QID) | INTRAVENOUS | Status: DC | PRN
  Administered 2014-05-09: 500 mg via INTRAVENOUS
  Filled 2014-05-09 (×2): qty 5

## 2014-05-09 MED ORDER — ROCURONIUM BROMIDE 50 MG/5ML IV SOLN
INTRAVENOUS | Status: AC
  Filled 2014-05-09: qty 1

## 2014-05-09 MED ORDER — SENNOSIDES-DOCUSATE SODIUM 8.6-50 MG PO TABS: 1.0000 | ORAL_TABLET | Freq: Every evening | ORAL | Status: DC | PRN

## 2014-05-09 MED ORDER — MIDAZOLAM HCL 5 MG/5ML IJ SOLN
INTRAMUSCULAR | Status: DC | PRN
  Administered 2014-05-09 (×2): 1 mg via INTRAVENOUS

## 2014-05-09 MED ORDER — ARTIFICIAL TEARS OP OINT
TOPICAL_OINTMENT | OPHTHALMIC | Status: DC | PRN
  Administered 2014-05-09: 1 via OPHTHALMIC

## 2014-05-09 MED ORDER — PHENYLEPHRINE HCL 10 MG/ML IJ SOLN
INTRAMUSCULAR | Status: DC | PRN
  Administered 2014-05-09: 80 ug via INTRAVENOUS

## 2014-05-09 MED ORDER — ATORVASTATIN CALCIUM 20 MG PO TABS
20.0000 mg | ORAL_TABLET | Freq: Every day | ORAL | Status: DC
  Administered 2014-05-09 – 2014-05-10 (×2): 20 mg via ORAL
  Filled 2014-05-09: qty 2
  Filled 2014-05-09 (×2): qty 1

## 2014-05-09 MED ORDER — WARFARIN - PHARMACIST DOSING INPATIENT: Freq: Every day | Status: DC

## 2014-05-09 MED ORDER — LACTATED RINGERS IV SOLN
INTRAVENOUS | Status: DC | PRN
  Administered 2014-05-09: 07:00:00 via INTRAVENOUS

## 2014-05-09 MED ORDER — CHLORHEXIDINE GLUCONATE 4 % EX LIQD
60.0000 mL | Freq: Once | CUTANEOUS | Status: DC
  Filled 2014-05-09: qty 60

## 2014-05-09 MED ORDER — METOCLOPRAMIDE HCL 10 MG PO TABS: 5.0000 mg | ORAL_TABLET | Freq: Three times a day (TID) | ORAL | Status: DC | PRN

## 2014-05-09 MED ORDER — SODIUM CHLORIDE 0.9 % IV SOLN
INTRAVENOUS | Status: DC
  Administered 2014-05-10: 04:00:00 via INTRAVENOUS

## 2014-05-09 MED ORDER — WARFARIN SODIUM 6 MG PO TABS
12.0000 mg | ORAL_TABLET | Freq: Once | ORAL | Status: AC
Start: 2014-05-09 — End: 2014-05-09
  Administered 2014-05-09: 12 mg via ORAL
  Filled 2014-05-09: qty 2

## 2014-05-09 MED ORDER — BUPIVACAINE-EPINEPHRINE (PF) 0.5% -1:200000 IJ SOLN
INTRAMUSCULAR | Status: AC
  Filled 2014-05-09: qty 30

## 2014-05-09 MED ORDER — EPHEDRINE SULFATE 50 MG/ML IJ SOLN
INTRAMUSCULAR | Status: AC
  Filled 2014-05-09: qty 1

## 2014-05-09 MED ORDER — ACETAMINOPHEN 10 MG/ML IV SOLN
INTRAVENOUS | Status: AC
  Administered 2014-05-09: 1000 mg via INTRAVENOUS
  Filled 2014-05-09: qty 100

## 2014-05-09 MED ORDER — FENTANYL CITRATE 0.05 MG/ML IJ SOLN
INTRAMUSCULAR | Status: AC
  Filled 2014-05-09: qty 5

## 2014-05-09 MED ORDER — SODIUM CHLORIDE 0.9 % IV SOLN: INTRAVENOUS | Status: DC

## 2014-05-09 MED ORDER — BUPIVACAINE-EPINEPHRINE (PF) 0.5% -1:200000 IJ SOLN
INTRAMUSCULAR | Status: DC | PRN
  Administered 2014-05-09: 30 mL via PERINEURAL

## 2014-05-09 MED ORDER — ROCURONIUM BROMIDE 100 MG/10ML IV SOLN
INTRAVENOUS | Status: DC | PRN
  Administered 2014-05-09: 40 mg via INTRAVENOUS

## 2014-05-09 MED ORDER — SODIUM CHLORIDE 0.9 % IR SOLN
Status: DC | PRN
  Administered 2014-05-09: 2000 mL
  Administered 2014-05-09: 1000 mL

## 2014-05-09 MED ORDER — LACTATED RINGERS IV SOLN
INTRAVENOUS | Status: DC
  Administered 2014-05-09: 08:00:00 via INTRAVENOUS

## 2014-05-09 MED ORDER — BUPIVACAINE LIPOSOME 1.3 % IJ SUSP
20.0000 mL | Freq: Once | INTRAMUSCULAR | Status: AC
  Administered 2014-05-09: 20 mL
  Filled 2014-05-09: qty 20

## 2014-05-09 MED ORDER — MIDAZOLAM HCL 2 MG/2ML IJ SOLN
INTRAMUSCULAR | Status: AC
  Filled 2014-05-09: qty 2

## 2014-05-09 MED ORDER — SODIUM CHLORIDE 0.9 % IJ SOLN
INTRAMUSCULAR | Status: AC
  Filled 2014-05-09: qty 10

## 2014-05-09 MED ORDER — FENTANYL CITRATE 0.05 MG/ML IJ SOLN: 25.0000 ug | INTRAMUSCULAR | Status: DC | PRN

## 2014-05-09 MED ORDER — ENOXAPARIN SODIUM 30 MG/0.3ML ~~LOC~~ SOLN
30.0000 mg | Freq: Two times a day (BID) | SUBCUTANEOUS | Status: DC
  Administered 2014-05-10: 30 mg via SUBCUTANEOUS
  Filled 2014-05-09: qty 0.3

## 2014-05-09 MED ORDER — SCOPOLAMINE 1 MG/3DAYS TD PT72
1.0000 | MEDICATED_PATCH | TRANSDERMAL | Status: DC
  Administered 2014-05-09: 1.5 mg via TRANSDERMAL
  Filled 2014-05-09: qty 1

## 2014-05-09 MED ORDER — NEOSTIGMINE METHYLSULFATE 10 MG/10ML IV SOLN
INTRAVENOUS | Status: AC
  Filled 2014-05-09: qty 1

## 2014-05-09 MED ORDER — ONDANSETRON HCL 4 MG/2ML IJ SOLN
INTRAMUSCULAR | Status: DC | PRN
  Administered 2014-05-09: 4 mg via INTRAVENOUS

## 2014-05-09 MED ORDER — PAROXETINE HCL 10 MG PO TABS
5.0000 mg | ORAL_TABLET | Freq: Every day | ORAL | Status: DC
  Administered 2014-05-09: 5 mg via ORAL
  Filled 2014-05-09 (×4): qty 0.5

## 2014-05-09 MED ORDER — METOCLOPRAMIDE HCL 5 MG/ML IJ SOLN: 5.0000 mg | Freq: Three times a day (TID) | INTRAMUSCULAR | Status: DC | PRN

## 2014-05-09 MED ORDER — ONDANSETRON HCL 4 MG/2ML IJ SOLN: 4.0000 mg | Freq: Four times a day (QID) | INTRAMUSCULAR | Status: DC | PRN

## 2014-05-09 MED ORDER — DIPHENHYDRAMINE HCL 12.5 MG/5ML PO ELIX: 12.5000 mg | ORAL_SOLUTION | ORAL | Status: DC | PRN

## 2014-05-09 MED ORDER — GLYCOPYRROLATE 0.2 MG/ML IJ SOLN
INTRAMUSCULAR | Status: AC
  Filled 2014-05-09: qty 2

## 2014-05-09 MED ORDER — TRANEXAMIC ACID 100 MG/ML IV SOLN
2000.0000 mg | INTRAVENOUS | Status: DC | PRN
  Administered 2014-05-09: 2000 mg via TOPICAL

## 2014-05-09 MED ORDER — ALUM & MAG HYDROXIDE-SIMETH 200-200-20 MG/5ML PO SUSP: 30.0000 mL | ORAL | Status: DC | PRN

## 2014-05-09 MED ORDER — ACETAMINOPHEN 325 MG PO TABS: 650.0000 mg | ORAL_TABLET | Freq: Four times a day (QID) | ORAL | Status: DC | PRN

## 2014-05-09 MED ORDER — PROMETHAZINE HCL 25 MG/ML IJ SOLN: 6.2500 mg | INTRAMUSCULAR | Status: DC | PRN

## 2014-05-09 MED ORDER — GLYCOPYRROLATE 0.2 MG/ML IJ SOLN
INTRAMUSCULAR | Status: AC
  Filled 2014-05-09: qty 1

## 2014-05-09 MED ORDER — NEOSTIGMINE METHYLSULFATE 10 MG/10ML IV SOLN
INTRAVENOUS | Status: DC | PRN
  Administered 2014-05-09: 3 mg via INTRAVENOUS

## 2014-05-09 MED ORDER — ACETAMINOPHEN 650 MG RE SUPP: 650.0000 mg | Freq: Four times a day (QID) | RECTAL | Status: DC | PRN

## 2014-05-09 MED ORDER — LIDOCAINE HCL (CARDIAC) 20 MG/ML IV SOLN
INTRAVENOUS | Status: AC
  Filled 2014-05-09: qty 5

## 2014-05-09 MED ORDER — PHENYLEPHRINE 40 MCG/ML (10ML) SYRINGE FOR IV PUSH (FOR BLOOD PRESSURE SUPPORT)
PREFILLED_SYRINGE | INTRAVENOUS | Status: AC
  Filled 2014-05-09: qty 10

## 2014-05-09 MED ORDER — CEFAZOLIN SODIUM 1-5 GM-% IV SOLN
1.0000 g | Freq: Four times a day (QID) | INTRAVENOUS | Status: AC
  Administered 2014-05-09 (×2): 1 g via INTRAVENOUS
  Filled 2014-05-09 (×2): qty 50

## 2014-05-09 MED ORDER — MEPERIDINE HCL 25 MG/ML IJ SOLN
6.2500 mg | INTRAMUSCULAR | Status: DC | PRN
Start: 2014-05-09 — End: 2014-05-09

## 2014-05-09 MED ORDER — MECLIZINE HCL 25 MG PO TABS
25.0000 mg | ORAL_TABLET | Freq: Three times a day (TID) | ORAL | Status: DC | PRN
  Filled 2014-05-09: qty 1

## 2014-05-09 MED ORDER — FLEET ENEMA 7-19 GM/118ML RE ENEM: 1.0000 | ENEMA | Freq: Once | RECTAL | Status: AC | PRN

## 2014-05-09 MED ORDER — FENTANYL CITRATE 0.05 MG/ML IJ SOLN
INTRAMUSCULAR | Status: AC
  Filled 2014-05-09: qty 2

## 2014-05-09 MED ORDER — GLYCOPYRROLATE 0.2 MG/ML IJ SOLN
INTRAMUSCULAR | Status: DC | PRN
  Administered 2014-05-09: .4 mg via INTRAVENOUS
  Administered 2014-05-09: 0.2 mg via INTRAVENOUS

## 2014-05-09 MED ORDER — LIDOCAINE HCL (CARDIAC) 20 MG/ML IV SOLN
INTRAVENOUS | Status: DC | PRN
  Administered 2014-05-09: 100 mg via INTRAVENOUS

## 2014-05-09 MED ORDER — PROPOFOL 10 MG/ML IV BOLUS
INTRAVENOUS | Status: DC | PRN
  Administered 2014-05-09: 150 mg via INTRAVENOUS

## 2014-05-09 MED ORDER — PHENOL 1.4 % MT LIQD: 1.0000 | OROMUCOSAL | Status: DC | PRN

## 2014-05-09 MED ORDER — EPHEDRINE SULFATE 50 MG/ML IJ SOLN
INTRAMUSCULAR | Status: DC | PRN
  Administered 2014-05-09: 15 mg via INTRAVENOUS
  Administered 2014-05-09 (×3): 5 mg via INTRAVENOUS

## 2014-05-09 MED ORDER — FENTANYL CITRATE 0.05 MG/ML IJ SOLN
INTRAMUSCULAR | Status: DC | PRN
  Administered 2014-05-09 (×3): 50 ug via INTRAVENOUS
  Administered 2014-05-09 (×2): 100 ug via INTRAVENOUS
  Administered 2014-05-09: 50 ug via INTRAVENOUS
  Administered 2014-05-09: 100 ug via INTRAVENOUS

## 2014-05-09 MED ORDER — ARTIFICIAL TEARS OP OINT
TOPICAL_OINTMENT | OPHTHALMIC | Status: AC
  Filled 2014-05-09: qty 3.5

## 2014-05-09 MED ORDER — ZOLPIDEM TARTRATE 5 MG PO TABS
5.0000 mg | ORAL_TABLET | Freq: Every evening | ORAL | Status: DC | PRN
Start: 2014-05-09 — End: 2014-05-10

## 2014-05-09 MED ORDER — DOCUSATE SODIUM 100 MG PO CAPS
100.0000 mg | ORAL_CAPSULE | Freq: Two times a day (BID) | ORAL | Status: DC
  Administered 2014-05-09 – 2014-05-10 (×3): 100 mg via ORAL
  Filled 2014-05-09 (×2): qty 1

## 2014-05-09 MED ORDER — MENTHOL 3 MG MT LOZG: 1.0000 | LOZENGE | OROMUCOSAL | Status: DC | PRN

## 2014-05-09 MED ORDER — ONDANSETRON HCL 4 MG PO TABS: 4.0000 mg | ORAL_TABLET | Freq: Four times a day (QID) | ORAL | Status: DC | PRN

## 2014-05-09 MED ORDER — BISACODYL 5 MG PO TBEC: 5.0000 mg | DELAYED_RELEASE_TABLET | Freq: Every day | ORAL | Status: DC | PRN

## 2014-05-09 MED ORDER — OXYCODONE HCL ER 10 MG PO T12A
10.0000 mg | EXTENDED_RELEASE_TABLET | Freq: Two times a day (BID) | ORAL | Status: DC
  Administered 2014-05-09 – 2014-05-10 (×2): 10 mg via ORAL
  Filled 2014-05-09 (×2): qty 1

## 2014-05-09 MED ORDER — OXYCODONE HCL 5 MG PO TABS
5.0000 mg | ORAL_TABLET | ORAL | Status: DC | PRN
  Administered 2014-05-10 (×3): 10 mg via ORAL
  Filled 2014-05-09 (×3): qty 2

## 2014-05-09 MED ORDER — DEXAMETHASONE SODIUM PHOSPHATE 10 MG/ML IJ SOLN
INTRAMUSCULAR | Status: DC | PRN
  Administered 2014-05-09: 10 mg via INTRAVENOUS

## 2014-05-09 MED ORDER — ONDANSETRON HCL 4 MG/2ML IJ SOLN
INTRAMUSCULAR | Status: AC
  Filled 2014-05-09: qty 2

## 2014-05-09 SURGICAL SUPPLY — 58 items
BANDAGE ESMARK 6X9 LF (GAUZE/BANDAGES/DRESSINGS) ×1 IMPLANT
BLADE SAGITTAL 13X1.27X60 (BLADE) ×2 IMPLANT
BLADE SAGITTAL 13X1.27X60MM (BLADE) ×1
BLADE SAW SGTL 83.5X18.5 (BLADE) ×3 IMPLANT
BLADE SURG 10 STRL SS (BLADE) ×3 IMPLANT
BNDG ESMARK 6X9 LF (GAUZE/BANDAGES/DRESSINGS) ×3
BOWL SMART MIX CTS (DISPOSABLE) ×3 IMPLANT
CAPT KNEE TOTAL 3 ×3 IMPLANT
CEMENT BONE SIMPLEX SPEEDSET (Cement) ×6 IMPLANT
COVER SURGICAL LIGHT HANDLE (MISCELLANEOUS) ×3 IMPLANT
CUFF TOURNIQUET SINGLE 34IN LL (TOURNIQUET CUFF) ×3 IMPLANT
CWS 400 CLOSED WOUND SUCTION KIT MEDIUM ×3 IMPLANT
DRAPE EXTREMITY T 121X128X90 (DRAPE) ×3 IMPLANT
DRAPE IMP U-DRAPE 54X76 (DRAPES) ×3 IMPLANT
DRAPE INCISE IOBAN 66X45 STRL (DRAPES) ×6 IMPLANT
DRAPE PROXIMA HALF (DRAPES) ×3 IMPLANT
DRAPE U-SHAPE 47X51 STRL (DRAPES) ×3 IMPLANT
DRSG ADAPTIC 3X8 NADH LF (GAUZE/BANDAGES/DRESSINGS) ×3 IMPLANT
DRSG PAD ABDOMINAL 8X10 ST (GAUZE/BANDAGES/DRESSINGS) ×6 IMPLANT
DURAPREP 26ML APPLICATOR (WOUND CARE) ×6 IMPLANT
ELECT REM PT RETURN 9FT ADLT (ELECTROSURGICAL) ×3
ELECTRODE REM PT RTRN 9FT ADLT (ELECTROSURGICAL) ×1 IMPLANT
GAUZE SPONGE 4X4 12PLY STRL (GAUZE/BANDAGES/DRESSINGS) ×3 IMPLANT
GLOVE BIOGEL M 7.0 STRL (GLOVE) ×3 IMPLANT
GLOVE BIOGEL PI IND STRL 7.5 (GLOVE) ×1 IMPLANT
GLOVE BIOGEL PI IND STRL 8.5 (GLOVE) IMPLANT
GLOVE BIOGEL PI INDICATOR 7.5 (GLOVE) ×2
GLOVE BIOGEL PI INDICATOR 8.5 (GLOVE)
GLOVE SURG ORTHO 8.0 STRL STRW (GLOVE) ×6 IMPLANT
GOWN STRL REUS W/ TWL LRG LVL3 (GOWN DISPOSABLE) ×1 IMPLANT
GOWN STRL REUS W/ TWL XL LVL3 (GOWN DISPOSABLE) ×2 IMPLANT
GOWN STRL REUS W/TWL LRG LVL3 (GOWN DISPOSABLE) ×2
GOWN STRL REUS W/TWL XL LVL3 (GOWN DISPOSABLE) ×4
HANDPIECE INTERPULSE COAX TIP (DISPOSABLE) ×2
HOOD PEEL AWAY FACE SHEILD DIS (HOOD) ×9 IMPLANT
KIT BASIN OR (CUSTOM PROCEDURE TRAY) ×3 IMPLANT
KIT ROOM TURNOVER OR (KITS) ×3 IMPLANT
KNEE CAPITATED TOTAL 3 ×1 IMPLANT
MANIFOLD NEPTUNE II (INSTRUMENTS) ×3 IMPLANT
NEEDLE 22X1 1/2 (OR ONLY) (NEEDLE) ×6 IMPLANT
NS IRRIG 1000ML POUR BTL (IV SOLUTION) ×3 IMPLANT
PACK TOTAL JOINT (CUSTOM PROCEDURE TRAY) ×3 IMPLANT
PACK UNIVERSAL I (CUSTOM PROCEDURE TRAY) ×3 IMPLANT
PAD ARMBOARD 7.5X6 YLW CONV (MISCELLANEOUS) ×6 IMPLANT
PADDING CAST COTTON 6X4 STRL (CAST SUPPLIES) ×3 IMPLANT
SET HNDPC FAN SPRY TIP SCT (DISPOSABLE) ×1 IMPLANT
STAPLER VISISTAT 35W (STAPLE) ×3 IMPLANT
SUCTION FRAZIER TIP 10 FR DISP (SUCTIONS) ×3 IMPLANT
SUT BONE WAX W31G (SUTURE) ×3 IMPLANT
SUT VIC AB 0 CTB1 27 (SUTURE) ×6 IMPLANT
SUT VIC AB 1 CT1 27 (SUTURE) ×6
SUT VIC AB 1 CT1 27XBRD ANBCTR (SUTURE) ×3 IMPLANT
SUT VIC AB 2-0 CT1 27 (SUTURE) ×4
SUT VIC AB 2-0 CT1 TAPERPNT 27 (SUTURE) ×2 IMPLANT
SYR 20CC LL (SYRINGE) ×6 IMPLANT
TOWEL OR 17X24 6PK STRL BLUE (TOWEL DISPOSABLE) ×3 IMPLANT
TOWEL OR 17X26 10 PK STRL BLUE (TOWEL DISPOSABLE) ×3 IMPLANT
WATER STERILE IRR 1000ML POUR (IV SOLUTION) ×6 IMPLANT

## 2014-05-09 NOTE — Anesthesia Postprocedure Evaluation (Signed)
  Anesthesia Post-op Note  Patient: Jeffrey Hammond  Procedure(s) Performed: Procedure(s): RIGHT TOTAL KNEE ARTHROPLASTY (Right)  Patient Location: PACU  Anesthesia Type:General  Level of Consciousness: awake and alert   Airway and Oxygen Therapy: Patient Spontanous Breathing and Patient connected to nasal cannula oxygen  Post-op Pain: mild  Post-op Assessment: Post-op Vital signs reviewed, Patient's Cardiovascular Status Stable, Respiratory Function Stable, Patent Airway and No signs of Nausea or vomiting  Post-op Vital Signs: Reviewed and stable  Last Vitals:  Filed Vitals:   05/09/14 1115  BP: 94/70  Pulse: 91  Temp:   Resp: 21    Complications: No apparent anesthesia complications

## 2014-05-09 NOTE — Progress Notes (Signed)
Orthopedic Tech Progress Note Patient Details:  Jeffrey Hammond Feb 08, 1941 [id number]  CPM Right Knee CPM Right Knee: On Right Knee Flexion (Degrees): 90 Right Knee Extension (Degrees): 0 Additional Comments: trapeze bar patient helper  Footsie roll [name] 05/09/2014, 11:57 AM

## 2014-05-09 NOTE — H&P (Signed)
  Jeffrey Hammond MRN:  [id number] DOB/SEX:  1941-01-25/male  CHIEF COMPLAINT:  Painful right Knee  HISTORY: Patient is a 74 y.o. male presented with a history of pain in the right knee. Onset of symptoms was gradual starting several years ago with gradually worsening course since that time. Prior procedures on the knee include arthroscopy. Patient has been treated conservatively with over-the-counter NSAIDs and activity modification. Patient currently rates pain in the knee at 10 out of 10 with activity. There is pain at night.  PAST MEDICAL HISTORY: There are no active problems to display for this patient.  Past Medical History  Diagnosis Date  . Sleep apnea   . Dysrhythmia   . Hyperlipemia   . Dizziness   . Arthritis   . Inner ear disease   . Complication of anesthesia   . PONV (postoperative nausea and vomiting)     1 time years ago  . Varicose veins   . Anxiety   . GERD (gastroesophageal reflux disease)    Past Surgical History  Procedure Laterality Date  . Tonsillectomy    . Pilonidal cyst excision    . Colonoscopy    . Eye surgery Bilateral     Cataract      MEDICATIONS:   No prescriptions prior to admission    ALLERGIES:  No Known Allergies  REVIEW OF SYSTEMS:  A comprehensive review of systems was negative.   FAMILY HISTORY:  No family history on file.  SOCIAL HISTORY:   History  Substance Use Topics  . Smoking status: Current Every Day Smoker -- 1.00 packs/day for 55 years    Types: Cigarettes  . Smokeless tobacco: Not on file  . Alcohol Use: No     EXAMINATION:  Vital signs in last 24 hours:    General appearance: alert, cooperative and no distress Lungs: clear to auscultation bilaterally Heart: regular rate and rhythm, S1, S2 normal, no murmur, click, rub or gallop Abdomen: soft, non-tender; bowel sounds normal; no masses,  no organomegaly Extremities: extremities normal, atraumatic, no cyanosis or edema and Homans sign is negative, no sign of  DVT Pulses: 2+ and symmetric Skin: Skin color, texture, turgor normal. No rashes or lesions Neurologic: Sensory: normal  Musculoskeletal:  ROM 0-115, Ligaments intact,  Imaging Review Plain radiographs demonstrate severe degenerative joint disease of the right knee. The overall alignment is significant varus. The bone quality appears to be good for age and reported activity level.  Assessment/Plan: Primary osteoarthritis, right knee   The patient history, physical examination and imaging studies are consistent with advanced degenerative joint disease of the right knee. The patient has failed conservative treatment.  The clearance notes were reviewed.  After discussion with the patient it was felt that Total Knee Replacement was indicated. The procedure,  risks, and benefits of total knee arthroplasty were presented and reviewed. The risks including but not limited to aseptic loosening, infection, blood clots, vascular injury, stiffness, patella tracking problems complications among others were discussed. The patient acknowledged the explanation, agreed to proceed with the plan.  Jeffrey Hammond 05/09/2014, 6:36 AM

## 2014-05-09 NOTE — Anesthesia Procedure Notes (Addendum)
Anesthesia Regional Block:  Adductor canal block  Pre-Anesthetic Checklist: ,, timeout performed, Correct Patient, Correct Site, Correct Laterality, Correct Procedure, Correct Position, site marked, Risks and benefits discussed,  Surgical consent,  Pre-op evaluation,  At surgeon's request and post-op pain management  Laterality: Right and Lower  Prep: Maximum Sterile Barrier Precautions used and chloraprep       Needles:  Injection technique: Single-shot  Needle Type: Echogenic Stimulator Needle     Needle Length: 10cm 10 cm     Additional Needles:  Procedures: ultrasound guided (picture in chart) Adductor canal block Narrative:  Start time: 05/09/2014 8:05 AM End time: 05/09/2014 8:17 AM Injection made incrementally with aspirations every 5 mL.  Performed by: Personally  Anesthesiologist: Alexis Frock  Additional Notes: R AC canal block, 34m .5% marcaine with epi, multiple asp, talked to patient throughout procedure, no complications, sterile prep   Procedure Name: Intubation Date/Time: 05/09/2014 8:54 AM Performed by: FIzora GalaPre-anesthesia Checklist: Patient identified, Emergency Drugs available, Suction available and Patient being monitored Patient Re-evaluated:Patient Re-evaluated prior to inductionOxygen Delivery Method: Circle system utilized Preoxygenation: Pre-oxygenation with 100% oxygen Intubation Type: IV induction Ventilation: Mask ventilation without difficulty Laryngoscope Size: Miller and 3 Grade View: Grade II Tube type: Oral Tube size: 8.0 mm Number of attempts: 1 Airway Equipment and Method: Stylet and LTA kit utilized Placement Confirmation: ETT inserted through vocal cords under direct vision and positive ETCO2 Secured at: 22 cm Tube secured with: Tape Dental Injury: Teeth and Oropharynx as per pre-operative assessment

## 2014-05-09 NOTE — Progress Notes (Signed)
Orthopedic Tech Progress Note Patient Details:  Jeffrey Hammond 01/26/1941 [id number]  CPM Right Knee CPM Right Knee: On Right Knee Flexion (Degrees): 90 Right Knee Extension (Degrees): 0 Additional Comments: trapeze bar patient helper Viewed order from doctor's order list  [name] 05/09/2014, 11:56 AM

## 2014-05-09 NOTE — Progress Notes (Signed)
ANTICOAGULATION CONSULT NOTE - Initial Consult  Pharmacy Consult for coumadin Indication: atrial fibrillation and VTE prophylaxis  No Known Allergies  Patient Measurements: Weight: 253 lb (114.76 kg)   Vital Signs: Temp: 97.6 F (36.4 C) (02/15 1400) Temp Source: Oral (02/15 0658) BP: 128/78 mmHg (02/15 1400) Pulse Rate: 58 (02/15 1400)  Labs:  Recent Labs  05/09/14 0731  LABPROT 13.5  INR 1.02    Estimated Creatinine Clearance: 87.8 mL/min (by C-G formula based on Cr of 0.98).   Medical History: Past Medical History  Diagnosis Date  . Sleep apnea   . Dysrhythmia   . Hyperlipemia   . Dizziness   . Arthritis   . Inner ear disease   . Complication of anesthesia   . PONV (postoperative nausea and vomiting)     1 time years ago  . Varicose veins   . Anxiety   . GERD (gastroesophageal reflux disease)     Medications:  Prescriptions prior to admission  Medication Sig Dispense Refill Last Dose  . acetaminophen (TYLENOL) 500 MG tablet Take 1,000 mg by mouth every 6 (six) hours as needed for mild pain.   05/08/2014 at Unknown time  . doxycycline (ORACEA) 40 MG capsule Take 40 mg by mouth daily.   05/08/2014 at Unknown time  . meclizine (ANTIVERT) 25 MG tablet Take 25 mg by mouth 3 (three) times daily as needed for dizziness.   05/09/2014 at Unknown time  . sildenafil (VIAGRA) 100 MG tablet Take 100 mg by mouth daily as needed for erectile dysfunction.   Past Week at Unknown time  . atorvastatin (LIPITOR) 40 MG tablet Take 20 mg by mouth daily.   05/07/2014  . diphenhydrAMINE (BENADRYL) 25 MG tablet Take 25 mg by mouth every 6 (six) hours as needed for itching.   05/07/2014  . Omega-3 Fatty Acids (FISH OIL) 1000 MG CAPS Take 2,000 mg by mouth daily.   05/01/2014  . PARoxetine (PAXIL) 10 MG tablet Take 5 mg by mouth daily.   05/07/2014  . simethicone (MYLICON) 80 MG chewable tablet Chew 80 mg by mouth every 6 (six) hours as needed (Acid reflux).   More than a month at Unknown  time  . warfarin (COUMADIN) 3 MG tablet Take 3 mg by mouth daily. Taken with 5mg  to equal 8mg  daily   05/01/2014  . warfarin (COUMADIN) 5 MG tablet Take 5 mg by mouth daily. Taken with 3mg  with equal 8mg  daily   05/01/2014    Assessment: 74 yo M on coumadin PTA for afib, now s/p R TKR to resume coumadin for afib and VTE px.  His coumadin is followed by Dr. Bettina Gavia at [facility] in [place].  His home dose is 8 mg daily with last dose Saturday 2/6.  He says his INR has been well controlled on 8 mg daily (he takes a 5 mg + 3 mg tablets). INR is 1.02 today.  Baseline H/H 15.1/44/1 on 2/5 and pltc 213.  No bleeding reported.   Goal of Therapy:  INR 2-3 Monitor platelets by anticoagulation protocol: Yes   Plan:  LMWH 30 q12 per MD Coumadin 1.5 x home dose  = 12 mg po x 1 dose today Daily INR  Eudelia Bunch, Pharm.D. [phone] 05/09/2014 2:40 PM

## 2014-05-09 NOTE — Transfer of Care (Signed)
Immediate Anesthesia Transfer of Care Note  Patient: Jeffrey Hammond  Procedure(s) Performed: Procedure(s): RIGHT TOTAL KNEE ARTHROPLASTY (Right)  Patient Location: PACU  Anesthesia Type:General and Regional  Level of Consciousness: awake, pateint uncooperative and confused  Airway & Oxygen Therapy: Patient Spontanous Breathing and Patient connected to face mask oxygen  Post-op Assessment: Report given to RN, Post -op Vital signs reviewed and stable and Patient moving all extremities  Post vital signs: Reviewed and stable  Last Vitals:  Filed Vitals:   05/09/14 1111  BP:   Pulse:   Temp: 36.4 C  Resp:     Complications: No apparent anesthesia complications

## 2014-05-09 NOTE — Progress Notes (Signed)
Utilization review completed.  

## 2014-05-09 NOTE — Op Note (Signed)
TOTAL KNEE REPLACEMENT OPERATIVE NOTE:  05/09/2014  1:55 PM  PATIENT:  Jeffrey Hammond  74 y.o. male  PRE-OPERATIVE DIAGNOSIS:  primary osteoarthritis right knee  POST-OPERATIVE DIAGNOSIS:  primary osteoarthritis right knee  PROCEDURE:  Procedure(s): RIGHT TOTAL KNEE ARTHROPLASTY  SURGEON:  Surgeon(s): Vickey Huger, MD  PHYSICIAN ASSISTANT: Carlynn Spry, [facility]  ANESTHESIA:   general  DRAINS: Hemovac  SPECIMEN: None  COUNTS:  Correct  TOURNIQUET:   Total Tourniquet Time Documented: Thigh (Right) - 57 minutes Total: Thigh (Right) - 57 minutes   DICTATION:  Indication for procedure:    The patient is a 74 y.o. male who has failed conservative treatment for primary osteoarthritis right knee.  Informed consent was obtained prior to anesthesia. The risks versus benefits of the operation were explain and in a way the patient can, and did, understand.   On the implant demand matching protocol, this patient scored 10.  Therefore, this patient did" "did not receive a polyethylene insert with vitamin E which is a high demand implant.  Description of procedure:     The patient was taken to the operating room and placed under anesthesia.  The patient was positioned in the usual fashion taking care that all body parts were adequately padded and/or protected.  I foley catheter was not placed.  A tourniquet was applied and the leg prepped and draped in the usual sterile fashion.  The extremity was exsanguinated with the esmarch and tourniquet inflated to 350 mmHg.  Pre-operative range of motion was normal.  The knee was in 5 degree of mild valgus.  A midline incision approximately 6-7 inches long was made with a #10 blade.  A new blade was used to make a parapatellar arthrotomy going 2-3 cm into the quadriceps tendon, over the patella, and alongside the medial aspect of the patellar tendon.  A synovectomy was then performed with the #10 blade and forceps. I then elevated the deep MCL off the  medial tibial metaphysis subperiosteally around to the semimembranosus attachment.    I everted the patella and used calipers to measure patellar thickness.  I used the reamer to ream down to appropriate thickness to recreate the native thickness.  I then removed excess bone with the rongeur and sagittal saw.  I used the appropriately sized template and drilled the three lug holes.  I then put the trial in place and measured the thickness with the calipers to ensure recreation of the native thickness.  The trial was then removed and the patella subluxed and the knee brought into flexion.  A homan retractor was place to retract and protect the patella and lateral structures.  A Z-retractor was place medially to protect the medial structures.  The extra-medullary alignment system was used to make cut the tibial articular surface perpendicular to the anamotic axis of the tibia and in 3 degrees of posterior slope.  The cut surface and alignment jig was removed.  I then used the intramedullary alignment guide to make a 4 valgus cut on the distal femur.  I then marked out the epicondylar axis on the distal femur.  The posterior condylar axis measured 5 degrees.  I then used the anterior referencing sizer and measured the femur to be a size 9.  The 4-In-1 cutting block was screwed into place in external rotation matching the posterior condylar angle, making our cuts perpendicular to the epicondylar axis.  Anterior, posterior and chamfer cuts were made with the sagittal saw.  The cutting block and cut pieces  were removed.  A lamina spreader was placed in 90 degrees of flexion.  The ACL, PCL, menisci, and posterior condylar osteophytes were removed.  A 13 mm spacer blocked was found to offer good flexion and extension gap balance after minimal in degree releasing.   The scoop retractor was then placed and the femoral finishing block was pinned in place.  The small sagittal saw was used as well as the lug drill to  finish the femur.  The block and cut surfaces were removed and the medullary canal hole filled with autograft bone from the cut pieces.  The tibia was delivered forward in deep flexion and external rotation.  A size G tray was selected and pinned into place centered on the medial 1/3 of the tibial tubercle.  The reamer and keel was used to prepare the tibia through the tray.    I then trialed with the size 9 femur, size G tibia, a 13 mm insert and the 38 patella.  I had excellent flexion/extension gap balance, excellent patella tracking.  Flexion was full and beyond 120 degrees; extension was zero.  These components were chosen and the staff opened them to me on the back table while the knee was lavaged copiously and the cement mixed.  The soft tissue was infiltrated with 60cc of exparel 1.3% through a 21 gauge needle.  I cemented in the components and removed all excess cement.  The polyethylene tibial component was snapped into place and the knee placed in extension while cement was hardening.  The capsule was infilltrated with 30cc of .25% Marcaine with epinephrine.  A hemovac was place in the joint exiting superolaterally.  A pain pump was place superomedially superficial to the arthrotomy.  Once the cement was hard, the tourniquet was let down.  Hemostasis was obtained.  The arthrotomy was closed with figure-8 #1 vicryl sutures.  The deep soft tissues were closed with #0 vicryls and the subcuticular layer closed with a running #2-0 vicryl.  The skin was reapproximated and closed with skin staples.  The wound was dressed with xeroform, 4 x4's, 2 ABD sponges, a single layer of webril and a TED stocking.   The patient was then awakened, extubated, and taken to the recovery room in stable condition.  BLOOD LOSS:  300cc DRAINS: 1 hemovac, 1 pain catheter COMPLICATIONS:  None.  PLAN OF CARE: Admit to inpatient   PATIENT DISPOSITION:  PACU - hemodynamically stable.   Delay start of Pharmacological  VTE agent (>24hrs) due to surgical blood loss or risk of bleeding:  not applicable  Please fax a copy of this op note to my office at [phone] (please only include page 1 and 2 of the Case Information op note)

## 2014-05-09 NOTE — Plan of Care (Signed)
Problem: Consults Goal: Diagnosis- Total Joint Replacement Primary Total Knee     

## 2014-05-10 ENCOUNTER — Encounter (HOSPITAL_COMMUNITY): Payer: Self-pay | Admitting: Orthopedic Surgery

## 2014-05-10 LAB — BASIC METABOLIC PANEL
Anion gap: 10 (ref 5–15)
BUN: 9 mg/dL (ref 6–23)
CALCIUM: 8.4 mg/dL (ref 8.4–10.5)
CO2: 21 mmol/L (ref 19–32)
CREATININE: 0.86 mg/dL (ref 0.50–1.35)
Chloride: 107 mmol/L (ref 96–112)
GFR calc Af Amer: >90 mL/min (ref >90)
GFR, EST NON AFRICAN AMERICAN: 85 mL/min — AB (ref >90)
GLUCOSE: 116 mg/dL — AB (ref 70–99)
Potassium: 4 mmol/L (ref 3.5–5.1)
SODIUM: 138 mmol/L (ref 135–145)

## 2014-05-10 LAB — PROTIME-INR
INR: 1.23 (ref 0.00–1.49)
PROTHROMBIN TIME: 15.6 s — AB (ref 11.6–15.2)

## 2014-05-10 LAB — CBC
HEMATOCRIT: 35.9 % — AB (ref 39.0–52.0)
Hemoglobin: 11.9 g/dL — ABNORMAL LOW (ref 13.0–17.0)
MCH: 31 pg (ref 26.0–34.0)
MCHC: 33.1 g/dL (ref 30.0–36.0)
MCV: 93.5 fL (ref 78.0–100.0)
PLATELETS: 151 10*3/uL (ref 150–400)
RBC: 3.84 MIL/uL — AB (ref 4.22–5.81)
RDW: 13.2 % (ref 11.5–15.5)
WBC: 11.8 10*3/uL — AB (ref 4.0–10.5)

## 2014-05-10 MED ORDER — ENOXAPARIN SODIUM 40 MG/0.4ML ~~LOC~~ SOLN: 40.0000 mg | SUBCUTANEOUS | Status: DC

## 2014-05-10 MED ORDER — OXYCODONE HCL 5 MG PO TABS: 5.0000 mg | ORAL_TABLET | ORAL | Status: DC | PRN

## 2014-05-10 MED ORDER — METHOCARBAMOL 500 MG PO TABS: 500.0000 mg | ORAL_TABLET | Freq: Four times a day (QID) | ORAL | Status: DC

## 2014-05-10 MED ORDER — WARFARIN SODIUM 6 MG PO TABS
12.0000 mg | ORAL_TABLET | Freq: Once | ORAL | Status: AC
  Administered 2014-05-10: 12 mg via ORAL
  Filled 2014-05-10: qty 2

## 2014-05-10 NOTE — Progress Notes (Signed)
Physical Therapy Treatment Patient Details Name: Jeffrey Hammond MRN: [id number] DOB: 1940/07/20 Today's Date: 05/10/2014    History of Present Illness Pt is a 74 y.o. male s/p Rt TKA.     PT Comments    Pt and wife able to perform teach back technique and ambulate step with RW. Reviewed HEP handout with pt and wife. Pt safe from mobility standpoint to D/C home with wife. Awaiting MD D/C orders.   Follow Up Recommendations  Home health PT;Supervision/Assistance - 24 hour     Equipment Recommendations  None recommended by PT    Recommendations for Other Services OT consult     Precautions / Restrictions Precautions Precautions: Knee Precaution Booklet Issued: Yes (comment) Precaution Comments: reviewed knee precautions with pt and wife Restrictions Weight Bearing Restrictions: No Other Position/Activity Restrictions: WBAT    Mobility  Bed Mobility Overal bed mobility: Needs Assistance Bed Mobility: Supine to Sit     Supine to sit: Supervision;HOB elevated     General bed mobility comments: in recliner  Transfers Overall transfer level: Needs assistance Equipment used: Rolling walker (2 wheeled) Transfers: Sit to/from Stand Sit to Stand: Supervision         General transfer comment: cues for hand placement and safety with RW; wife also relaying cues for safety   Ambulation/Gait Ambulation/Gait assistance: Supervision Ambulation Distance (Feet): 220 Feet (110' x 2) Assistive device: Rolling walker (2 wheeled) Gait Pattern/deviations: Step-through pattern;Decreased stride length;Ataxic Gait velocity: decr Gait velocity interpretation: Below normal speed for age/gender General Gait Details: pt ambulating with step through gt; cues for RW safety    Stairs Stairs: Yes Stairs assistance: Min guard Stair Management: No rails;Step to pattern;Forwards;With walker Number of Stairs: 2 General stair comments: multimodal cues for technique; wife present and educated as  well; pt and wife able to perform teachback technique with stair management   Wheelchair Mobility    Modified Rankin (Stroke Patients Only)       Balance Overall balance assessment: Needs assistance Sitting-balance support: No upper extremity supported;Feet supported Sitting balance-Leahy Scale: Fair   Postural control: Posterior lean Standing balance support: Bilateral upper extremity supported;No upper extremity supported Standing balance-Leahy Scale: Poor Standing balance comment: relies on rw for balance                    Cognition Arousal/Alertness: Awake/alert Behavior During Therapy: WFL for tasks assessed/performed Overall Cognitive Status: Within Functional Limits for tasks assessed                      Exercises Total Joint Exercises Ankle Circles/Pumps: AROM;Both;10 reps Quad Sets: AROM;Right;10 reps Heel Slides: AAROM;Right;10 reps;Seated Hip ABduction/ADduction: AAROM;Right;10 reps Straight Leg Raises: AAROM;Right;10 reps;Seated Long Arc Quad: AROM;Right;10 reps;Other (comment) (cues for technique) Goniometric ROM: limited by pain 0 to 80    General Comments General comments (skin integrity, edema, etc.): discussed car transfer technique and home safety tips      Pertinent Vitals/Pain Pain Assessment: 0-10 Pain Score: 6  Pain Location: Rt knee with movement  Pain Descriptors / Indicators: Aching;Sore Pain Intervention(s): Monitored during session;Premedicated before session;Repositioned;Ice applied    Home Living Family/patient expects to be discharged to:: Private residence Living Arrangements: Spouse/significant other Available Help at Discharge: Family;Available 24 hours/day Type of Home: House Home Access: Stairs to enter Entrance Stairs-Rails: None Home Layout: Able to live on main level with bedroom/bathroom Home Equipment: Walker - 2 wheels;Bedside commode Additional Comments: walk in shower    Prior Function Level of  Independence: Independent          PT Goals (current goals can now be found in the care plan section) Acute Rehab PT Goals Patient Stated Goal: home after 5 PT Goal Formulation: With patient Time For Goal Achievement: 05/14/14 Potential to Achieve Goals: Fair Progress towards PT goals: Progressing toward goals    Frequency  7X/week    PT Plan Current plan remains appropriate    Co-evaluation             End of Session Equipment Utilized During Treatment: Gait belt Activity Tolerance: Patient tolerated treatment well Patient left: in chair;with call bell/phone within reach;with family/visitor present     Time: 5038-8828 PT Time Calculation (min) (ACUTE ONLY): 25 min  Charges:  $Gait Training: 8-22 mins $Therapeutic Exercise: 8-22 mins                    G CodesGustavus Hammond, [place]  [phone] 05/10/2014, 11:35 AM

## 2014-05-10 NOTE — Evaluation (Signed)
Physical Therapy Evaluation Patient Details Name: Jeffrey Hammond MRN: [id number] DOB: May 31, 1940 Today's Date: 05/10/2014   History of Present Illness  Pt is a 74 y.o. male s/p Rt TKA.   Clinical Impression  Pt is s/p Rt TKA POD#1 resulting in the deficits listed below (see PT Problem List).  Pt will benefit from skilled PT to increase their independence and safety with mobility to allow discharge to the venue listed below. Pt hopeful to D/C today. Patient needs to practice stairs next session prior to D/C home.       Follow Up Recommendations Home health PT;Supervision/Assistance - 24 hour    Equipment Recommendations  None recommended by PT    Recommendations for Other Services OT consult     Precautions / Restrictions Precautions Precautions: Knee Precaution Booklet Issued: Yes (comment) Precaution Comments: given HEP and reviewed knee precautions with pt and wife Restrictions Weight Bearing Restrictions: No Other Position/Activity Restrictions: WBAT      Mobility  Bed Mobility Overal bed mobility: Needs Assistance Bed Mobility: Supine to Sit     Supine to sit: Supervision;HOB elevated     General bed mobility comments: min cues for technique; incr time due to pain  Transfers Overall transfer level: Needs assistance Equipment used: Rolling walker (2 wheeled) Transfers: Sit to/from Stand Sit to Stand: Mod assist         General transfer comment: mod (A) to balance and elevate to standing position; pt with heavy posterior lean initially; stood fo ~2 min for "inner ear dizziness" to subside; cues for technique   Ambulation/Gait Ambulation/Gait assistance: Min guard Ambulation Distance (Feet): 80 Feet Assistive device: Rolling walker (2 wheeled) Gait Pattern/deviations: Step-through pattern;Decreased step length - left;Decreased stance time - right;Antalgic;Trunk flexed Gait velocity: decr Gait velocity interpretation: Below normal speed for age/gender General  Gait Details: cues for step through technique and management of RW  Stairs            Wheelchair Mobility    Modified Rankin (Stroke Patients Only)       Balance Overall balance assessment: Needs assistance Sitting-balance support: Feet supported;No upper extremity supported Sitting balance-Leahy Scale: Fair   Postural control: Posterior lean Standing balance support: During functional activity;Bilateral upper extremity supported Standing balance-Leahy Scale: Poor Standing balance comment: heavy posterior lean initially; mod (A) to balance and RW at all times                             Pertinent Vitals/Pain Pain Assessment: 0-10 Pain Score: 3  Pain Location: Rt knee Pain Descriptors / Indicators: Aching;Sore Pain Intervention(s): Monitored during session;Premedicated before session;Repositioned;Ice applied    Home Living Family/patient expects to be discharged to:: Private residence Living Arrangements: Spouse/significant other Available Help at Discharge: Family;Available 24 hours/day Type of Home: House Home Access: Stairs to enter Entrance Stairs-Rails: None Entrance Stairs-Number of Steps: 2 Home Layout: Able to live on main level with bedroom/bathroom Home Equipment: Walker - 2 wheels;Bedside commode Additional Comments: walk in shower    Prior Function Level of Independence: Independent               Hand Dominance        Extremity/Trunk Assessment   Upper Extremity Assessment: Defer to OT evaluation           Lower Extremity Assessment: RLE deficits/detail RLE Deficits / Details: 0 to 70 degrees AROm in sitting; 3/5    Cervical / Trunk Assessment: Normal  Communication  Communication: HOH  Cognition Arousal/Alertness: Awake/alert Behavior During Therapy: WFL for tasks assessed/performed Overall Cognitive Status: Within Functional Limits for tasks assessed                      General Comments       Exercises Total Joint Exercises Ankle Circles/Pumps: AROM;Both;10 reps Quad Sets: AROM;Right;10 reps Heel Slides: AAROM;Right;10 reps;Seated Hip ABduction/ADduction: AAROM;Right;10 reps      Assessment/Plan    PT Assessment Patient needs continued PT services  PT Diagnosis Difficulty walking;Generalized weakness;Acute pain   PT Problem List Decreased strength;Decreased range of motion;Decreased balance;Decreased activity tolerance;Decreased mobility;Decreased knowledge of use of DME;Decreased safety awareness;Decreased knowledge of precautions;Pain  PT Treatment Interventions DME instruction;Gait training;Stair training;Functional mobility training;Therapeutic activities;Therapeutic exercise;Balance training;Neuromuscular re-education;Patient/family education   PT Goals (Current goals can be found in the Care Plan section) Acute Rehab PT Goals Patient Stated Goal: to go home today and sleep in my bed PT Goal Formulation: With patient Time For Goal Achievement: 05/14/14 Potential to Achieve Goals: Fair    Frequency 7X/week   Barriers to discharge        Co-evaluation               End of Session Equipment Utilized During Treatment: Gait belt Activity Tolerance: Patient tolerated treatment well Patient left: in chair;with call bell/phone within reach;with family/visitor present Nurse Communication: Mobility status         Time: 1696-7893 PT Time Calculation (min) (ACUTE ONLY): 27 min   Charges:   PT Evaluation $Initial PT Evaluation Tier I: 1 Procedure PT Treatments $Gait Training: 8-22 mins   PT G CodesGustavus Bryant, [place]  [phone] 05/10/2014, 9:55 AM

## 2014-05-10 NOTE — Progress Notes (Signed)
ANTICOAGULATION CONSULT NOTE - Follow Up Consult  Pharmacy Consult for warfarin Indication: atrial fibrillation and VTE prophylaxis  No Known Allergies  Patient Measurements: Weight: 253 lb (114.76 kg)  Vital Signs: Temp: 99.2 F (37.3 C) (02/16 1344) Temp Source: Oral (02/16 1344) BP: 119/61 mmHg (02/16 1344) Pulse Rate: 74 (02/16 1344)  Labs:  Recent Labs  05/09/14 0731 05/09/14 1654 05/10/14 0634  HGB  --  13.0 11.9*  HCT  --  38.7* 35.9*  PLT  --  167 151  LABPROT 13.5  --  15.6*  INR 1.02  --  1.23  CREATININE  --  0.88 0.86    Estimated Creatinine Clearance: 100 mL/min (by C-G formula based on Cr of 0.86).   Assessment: 74 yo M on warfarin PTA for afib, now s/p R TKA to resume warfarin for afib and VTE px. His warfarin is followed by Dr. Bettina Gavia at [facility] in [place]. His home dose is 8 mg daily with last dose Saturday 2/6. He says his INR has been well controlled on 8 mg daily (he takes a 5 mg + 3 mg tablets). INR is 1.23 today.No bleeding reported, ABLA post-op with Hb 11.9.   Goal of Therapy:  INR 2-3 Monitor platelets by anticoagulation protocol: Yes   Plan:  - Warfarin 12 mg PO tonight (1.5x home dose) - INR daily  [facility], Pharm.D., BCPS Clinical Pharmacist Pager: [phone] 05/10/2014 1:50 PM

## 2014-05-10 NOTE — Progress Notes (Signed)
05/10/14 Set up with Jeffrey Hammond [facility] for HHPT by MD office. Spoke with patient and his wife, no change in d/c plan. Spoke with Ruby Cola at [employer], they are providing CPM, 3N1, and rolling walker. Patient stated that his wife will be available to assist him after discharge.Will continue to follow until discharge.

## 2014-05-10 NOTE — Discharge Instructions (Signed)
Diet: As you were doing prior to hospitalization  ° °Activity:  Increase activity slowly as tolerated  °                No lifting or driving for 6 weeks ° °Shower:  May shower without a dressing once there is no drainage from your wound. °                Do NOT wash over the wound. °                °Dressing:  You may change your dressing on Wednesday °                   Then change the dressing daily with sterile 4"x4"s gauze dressing  °                   And TED hose for knees. ° °Weight Bearing:  Weight bearing as tolerated as taught in physical therapy.  Use a                                walker or Crutches as instructed. ° °To prevent constipation: you may use a stool softener such as - °              Colace ( over the counter) 100 mg by mouth twice a day  °              Drink plenty of fluids ( prune juice may be helpful) and high fiber foods °               Miralax ( over the counter) for constipation as needed.   ° °Precautions:  If you experience chest pain or shortness of breath - call 911 immediately               For transfer to the hospital emergency department!! °              If you develop a fever greater that 101 F, purulent drainage from wound,                             increased redness or drainage from wound, or calf pain -- Call the office. ° °Follow- Up Appointment:  Please call for an appointment to be seen on 05/24/14 °                                             [place] office:  [phone] °           [address] [place], [place] [address] °               ° ° °

## 2014-05-10 NOTE — Evaluation (Signed)
Occupational Therapy Evaluation Patient Details Name: Jeffrey Hammond MRN: [id number] DOB: 1941/03/19 Today's Date: 05/10/2014    History of Present Illness Pt is a 74 y.o. male s/p Rt TKA.    Clinical Impression   Pt admitted with the above diagnoses and presents with below problem list. Pt will benefit from continued acute OT to address the below listed deficits and maximize independence with basic ADLs prior to d/c home with family. PTA pt was independent with ADLs. Pt currently at min A level with LB ADLs and transfers.       Follow Up Recommendations  Supervision/Assistance - 24 hour;No OT follow up    Equipment Recommendations  None recommended by OT;Other (comment) (pt has recommended DME)    Recommendations for Other Services       Precautions / Restrictions Precautions Precautions: Knee Precaution Booklet Issued: Yes (comment) Precaution Comments: reviewed precautions  Restrictions Weight Bearing Restrictions: No Other Position/Activity Restrictions: WBAT      Mobility Bed Mobility Overal bed mobility: Needs Assistance Bed Mobility: Supine to Sit     Supine to sit: Supervision;HOB elevated     General bed mobility comments: in recliner  Transfers Overall transfer level: Needs assistance Equipment used: Rolling walker (2 wheeled) Transfers: Sit to/from Stand Sit to Stand: Min assist         General transfer comment: pt needing extra time before initiating standing position due to baseline dizziness that worsens when sitting; no c/o dizziness in standing or ambulating; min A for power up and to steady balance in standing    Balance Overall balance assessment: Needs assistance Sitting-balance support: No upper extremity supported;Feet supported Sitting balance-Leahy Scale: Fair   Postural control: Posterior lean Standing balance support: Bilateral upper extremity supported;No upper extremity supported Standing balance-Leahy Scale: Poor Standing balance  comment: relies on rw for balance                            ADL Overall ADL's : Needs assistance/impaired Eating/Feeding: Set up;Sitting   Grooming: Set up;Sitting   Upper Body Bathing: Set up;Sitting   Lower Body Bathing: Minimal assistance;Sit to/from stand;With adaptive equipment Lower Body Bathing Details (indicate cue type and reason): min A for balance Upper Body Dressing : Set up;Sitting   Lower Body Dressing: Minimal assistance;Sit to/from stand;With adaptive equipment Lower Body Dressing Details (indicate cue type and reason): min A for balance Toilet Transfer: Minimal assistance;Ambulation;RW;BSC Toilet Transfer Details (indicate cue type and reason): min A for balance Toileting- Clothing Manipulation and Hygiene: Sit to/from stand;Minimal assistance   Tub/ Shower Transfer: Minimal assistance;Ambulation;3 in 1;Rolling walker Tub/Shower Transfer Details (indicate cue type and reason): min A for balance Functional mobility during ADLs: Min guard;Rolling walker General ADL Comments: Pt completed in room ambualtion to 3n1 in bathroom at min guard - min A level. Min A needed to steady balance, min A needed to assist with powerup from recliner to standing posiition. Educated pt and spouse on techniques and AE for safe completion of ADLs with knee precautions.     Vision     Perception     Praxis      Pertinent Vitals/Pain Pain Assessment: 0-10 Pain Score: 5  Pain Location: rt knee Pain Descriptors / Indicators: Aching;Sore Pain Intervention(s): Limited activity within patient's tolerance;Monitored during session;Repositioned;RN gave pain meds during session;Ice applied     Hand Dominance     Extremity/Trunk Assessment Upper Extremity Assessment Upper Extremity Assessment: Overall WFL for tasks assessed  Lower Extremity Assessment Lower Extremity Assessment: Defer to PT evaluation RLE Deficits / Details: 0 to 70 degrees AROm in sitting; 3/5 RLE  Coordination: decreased gross motor   Cervical / Trunk Assessment Cervical / Trunk Assessment: Normal   Communication Communication Communication: HOH   Cognition Arousal/Alertness: Awake/alert Behavior During Therapy: WFL for tasks assessed/performed Overall Cognitive Status: Within Functional Limits for tasks assessed                     General Comments       Exercises Exercises: Total Joint     Shoulder Instructions      Home Living Family/patient expects to be discharged to:: Private residence Living Arrangements: Spouse/significant other Available Help at Discharge: Family;Available 24 hours/day Type of Home: House Home Access: Stairs to enter [employer] of Steps: 2 Entrance Stairs-Rails: None Home Layout: Able to live on main level with bedroom/bathroom     Bathroom Shower/Tub: Occupational psychologist: Standard     Home Equipment: Environmental consultant - 2 wheels;Bedside commode   Additional Comments: walk in shower      Prior Functioning/Environment Level of Independence: Independent             OT Diagnosis: Acute pain   OT Problem List: Impaired balance (sitting and/or standing);Decreased knowledge of use of DME or AE;Decreased knowledge of precautions;Pain   OT Treatment/Interventions: Self-care/ADL training;DME and/or AE instruction;Patient/family education;Balance training;Therapeutic activities    OT Goals(Current goals can be found in the care plan section) Acute Rehab OT Goals Patient Stated Goal: go home today OT Goal Formulation: With patient Time For Goal Achievement: 05/17/14 Potential to Achieve Goals: Good ADL Goals Pt Will Perform Lower Body Bathing: with supervision;with adaptive equipment;sit to/from stand Pt Will Perform Lower Body Dressing: with supervision;with adaptive equipment;sit to/from stand Pt Will Transfer to Toilet: with supervision;ambulating;bedside commode Pt Will Perform Toileting - Clothing  Manipulation and hygiene: with supervision;with adaptive equipment;sit to/from stand Pt Will Perform Tub/Shower Transfer: with supervision;ambulating;3 in 1;rolling walker  OT Frequency: Min 2X/week   Barriers to D/C:            Co-evaluation              End of Session Equipment Utilized During Treatment: Gait belt;Rolling walker CPM Right Knee CPM Right Knee: Off Additional Comments: zero knee on  Activity Tolerance: Patient tolerated treatment well Patient left: in chair;with call bell/phone within reach;with family/visitor present   Time: 2800-3491 OT Time Calculation (min): 20 min Charges:  OT General Charges $OT Visit: 1 Procedure OT Evaluation $Initial OT Evaluation Tier I: 1 Procedure G-Codes:    Hortencia Pilar 05-17-14, 10:32 AM

## 2014-05-10 NOTE — Progress Notes (Signed)
Orthopedic Tech Progress Note Patient Details:  Jeffrey Hammond 04/22/1940 [id number]  Patient ID: Jeffrey Hammond, male   DOB: 1940-07-25, 74 y.o.   MRN: [id number] Placed pt's rle in cpm @0 -70 degrees @1415   [name] 05/10/2014, 2:07 PM

## 2014-05-10 NOTE — Progress Notes (Signed)
SPORTS MEDICINE AND JOINT REPLACEMENT  Lara Mulch, MD   Carlynn Spry, PA-C [place], [place],   [address]                             [phone]   PROGRESS NOTE  Subjective:  negative for Chest Pain  negative for Shortness of Breath  negative for Nausea/Vomiting   negative for Calf Pain  negative for Bowel Movement   Tolerating Diet: yes         Patient reports pain as 4 on 0-10 scale.    Objective: Vital signs in last 24 hours:   Patient Vitals for the past 24 hrs:  BP Temp Temp src Pulse Resp SpO2  05/10/14 1034 123/66 mmHg 99.1 F (37.3 C) Oral 65 17 99 %  05/10/14 0556 (!) 106/52 mmHg 98.4 F (36.9 C) Oral 66 - 99 %  05/10/14 0400 - - - - 18 96 %  05/10/14 0109 116/69 mmHg 97.5 F (36.4 C) Axillary 69 - 94 %  05/10/14 0000 - - - - 18 95 %  05/09/14 2000 - - - - 16 96 %  05/09/14 1926 (!) 101/55 mmHg 98 F (36.7 C) Oral 71 - 97 %  05/09/14 1400 128/78 mmHg 97.6 F (36.4 C) - (!) 58 15 98 %  05/09/14 1345 - - - 65 - 100 %  05/09/14 1337 - 97.9 F (36.6 C) - - - -  05/09/14 1330 (!) 129/58 mmHg - - 64 18 100 %  05/09/14 1315 113/65 mmHg - - 65 15 98 %    @flow {1959:LAST@   Intake/Output from previous day:   02/15 0701 - 02/16 0700 In: 2625 [I.V.:2425] Out: 350 [Drains:350]   Intake/Output this shift:   02/16 0701 - 02/16 1900 In: -  Out: 200 [Drains:200]   Intake/Output      02/15 0701 - 02/16 0700 02/16 0701 - 02/17 0700   I.V. (mL/kg) 2425 (21.1)    Other 200    Total Intake(mL/kg) 2625 (22.9)    Drains 350 200   Total Output 350 200   Net +2275 -200           LABORATORY DATA:  Recent Labs  05/09/14 1654 05/10/14 0634  WBC 7.2 11.8*  HGB 13.0 11.9*  HCT 38.7* 35.9*  PLT 167 151    Recent Labs  05/09/14 1654 05/10/14 0634  NA  --  138  K  --  4.0  CL  --  107  CO2  --  21  BUN  --  9  CREATININE 0.88 0.86  GLUCOSE  --  116*  CALCIUM  --  8.4   Lab Results  Component Value Date   INR 1.23 05/10/2014    INR 1.02 05/09/2014    Examination:  General appearance: alert, cooperative and no distress Extremities: Homans sign is negative, no sign of DVT  Wound Exam: clean, dry, intact   Drainage:  None: wound tissue dry  Motor Exam: EHL and FHL Intact  Sensory Exam: Deep Peroneal normal   Assessment:    1 Day Post-Op  Procedure(s) (LRB): RIGHT TOTAL KNEE ARTHROPLASTY (Right)  ADDITIONAL DIAGNOSIS:  Active Problems:   S/P total knee arthroplasty  Acute Blood Loss Anemia   Plan: Physical Therapy as ordered Weight Bearing as Tolerated (WBAT)    DISCHARGE PLAN: Home  DISCHARGE NEEDS: HHPT, CPM, Walker and 3-in-1 comode seat  Jeffrey Hammond 05/10/2014, 1:05 PM

## 2014-05-11 DIAGNOSIS — Z7901 Long term (current) use of anticoagulants: Secondary | ICD-10-CM | POA: Diagnosis not present

## 2014-05-11 DIAGNOSIS — Z9181 History of falling: Secondary | ICD-10-CM | POA: Diagnosis not present

## 2014-05-11 DIAGNOSIS — I4891 Unspecified atrial fibrillation: Secondary | ICD-10-CM | POA: Diagnosis not present

## 2014-05-11 DIAGNOSIS — Z471 Aftercare following joint replacement surgery: Secondary | ICD-10-CM | POA: Diagnosis not present

## 2014-05-11 DIAGNOSIS — Z5181 Encounter for therapeutic drug level monitoring: Secondary | ICD-10-CM | POA: Diagnosis not present

## 2014-05-11 DIAGNOSIS — F17210 Nicotine dependence, cigarettes, uncomplicated: Secondary | ICD-10-CM | POA: Diagnosis not present

## 2014-05-12 DIAGNOSIS — Z5181 Encounter for therapeutic drug level monitoring: Secondary | ICD-10-CM | POA: Diagnosis not present

## 2014-05-12 DIAGNOSIS — I4891 Unspecified atrial fibrillation: Secondary | ICD-10-CM | POA: Diagnosis not present

## 2014-05-12 DIAGNOSIS — F17210 Nicotine dependence, cigarettes, uncomplicated: Secondary | ICD-10-CM | POA: Diagnosis not present

## 2014-05-12 DIAGNOSIS — Z9181 History of falling: Secondary | ICD-10-CM | POA: Diagnosis not present

## 2014-05-12 DIAGNOSIS — Z7901 Long term (current) use of anticoagulants: Secondary | ICD-10-CM | POA: Diagnosis not present

## 2014-05-12 DIAGNOSIS — Z471 Aftercare following joint replacement surgery: Secondary | ICD-10-CM | POA: Diagnosis not present

## 2014-05-13 DIAGNOSIS — Z9181 History of falling: Secondary | ICD-10-CM | POA: Diagnosis not present

## 2014-05-13 DIAGNOSIS — Z7901 Long term (current) use of anticoagulants: Secondary | ICD-10-CM | POA: Diagnosis not present

## 2014-05-13 DIAGNOSIS — I4891 Unspecified atrial fibrillation: Secondary | ICD-10-CM | POA: Diagnosis not present

## 2014-05-13 DIAGNOSIS — F17210 Nicotine dependence, cigarettes, uncomplicated: Secondary | ICD-10-CM | POA: Diagnosis not present

## 2014-05-13 DIAGNOSIS — Z471 Aftercare following joint replacement surgery: Secondary | ICD-10-CM | POA: Diagnosis not present

## 2014-05-13 DIAGNOSIS — Z9660 Presence of unspecified orthopedic joint implant: Secondary | ICD-10-CM | POA: Diagnosis not present

## 2014-05-13 DIAGNOSIS — Z5181 Encounter for therapeutic drug level monitoring: Secondary | ICD-10-CM | POA: Diagnosis not present

## 2014-05-14 DIAGNOSIS — Z9181 History of falling: Secondary | ICD-10-CM | POA: Diagnosis not present

## 2014-05-14 DIAGNOSIS — I4891 Unspecified atrial fibrillation: Secondary | ICD-10-CM | POA: Diagnosis not present

## 2014-05-14 DIAGNOSIS — Z471 Aftercare following joint replacement surgery: Secondary | ICD-10-CM | POA: Diagnosis not present

## 2014-05-14 DIAGNOSIS — Z7901 Long term (current) use of anticoagulants: Secondary | ICD-10-CM | POA: Diagnosis not present

## 2014-05-14 DIAGNOSIS — Z5181 Encounter for therapeutic drug level monitoring: Secondary | ICD-10-CM | POA: Diagnosis not present

## 2014-05-14 DIAGNOSIS — F17210 Nicotine dependence, cigarettes, uncomplicated: Secondary | ICD-10-CM | POA: Diagnosis not present

## 2014-05-16 DIAGNOSIS — I4891 Unspecified atrial fibrillation: Secondary | ICD-10-CM | POA: Diagnosis not present

## 2014-05-16 DIAGNOSIS — Z471 Aftercare following joint replacement surgery: Secondary | ICD-10-CM | POA: Diagnosis not present

## 2014-05-16 DIAGNOSIS — Z5181 Encounter for therapeutic drug level monitoring: Secondary | ICD-10-CM | POA: Diagnosis not present

## 2014-05-16 DIAGNOSIS — F17210 Nicotine dependence, cigarettes, uncomplicated: Secondary | ICD-10-CM | POA: Diagnosis not present

## 2014-05-16 DIAGNOSIS — Z7901 Long term (current) use of anticoagulants: Secondary | ICD-10-CM | POA: Diagnosis not present

## 2014-05-16 DIAGNOSIS — Z9181 History of falling: Secondary | ICD-10-CM | POA: Diagnosis not present

## 2014-05-17 DIAGNOSIS — I4891 Unspecified atrial fibrillation: Secondary | ICD-10-CM | POA: Diagnosis not present

## 2014-05-17 DIAGNOSIS — Z471 Aftercare following joint replacement surgery: Secondary | ICD-10-CM | POA: Diagnosis not present

## 2014-05-17 DIAGNOSIS — Z5181 Encounter for therapeutic drug level monitoring: Secondary | ICD-10-CM | POA: Diagnosis not present

## 2014-05-17 DIAGNOSIS — F17210 Nicotine dependence, cigarettes, uncomplicated: Secondary | ICD-10-CM | POA: Diagnosis not present

## 2014-05-17 DIAGNOSIS — Z7901 Long term (current) use of anticoagulants: Secondary | ICD-10-CM | POA: Diagnosis not present

## 2014-05-17 DIAGNOSIS — Z9181 History of falling: Secondary | ICD-10-CM | POA: Diagnosis not present

## 2014-05-18 DIAGNOSIS — Z471 Aftercare following joint replacement surgery: Secondary | ICD-10-CM | POA: Diagnosis not present

## 2014-05-18 DIAGNOSIS — Z5181 Encounter for therapeutic drug level monitoring: Secondary | ICD-10-CM | POA: Diagnosis not present

## 2014-05-18 DIAGNOSIS — I4891 Unspecified atrial fibrillation: Secondary | ICD-10-CM | POA: Diagnosis not present

## 2014-05-18 DIAGNOSIS — F17210 Nicotine dependence, cigarettes, uncomplicated: Secondary | ICD-10-CM | POA: Diagnosis not present

## 2014-05-18 DIAGNOSIS — Z7901 Long term (current) use of anticoagulants: Secondary | ICD-10-CM | POA: Diagnosis not present

## 2014-05-18 DIAGNOSIS — Z9181 History of falling: Secondary | ICD-10-CM | POA: Diagnosis not present

## 2014-05-19 DIAGNOSIS — F17210 Nicotine dependence, cigarettes, uncomplicated: Secondary | ICD-10-CM | POA: Diagnosis not present

## 2014-05-19 DIAGNOSIS — Z7901 Long term (current) use of anticoagulants: Secondary | ICD-10-CM | POA: Diagnosis not present

## 2014-05-19 DIAGNOSIS — Z471 Aftercare following joint replacement surgery: Secondary | ICD-10-CM | POA: Diagnosis not present

## 2014-05-19 DIAGNOSIS — Z5181 Encounter for therapeutic drug level monitoring: Secondary | ICD-10-CM | POA: Diagnosis not present

## 2014-05-19 DIAGNOSIS — Z9181 History of falling: Secondary | ICD-10-CM | POA: Diagnosis not present

## 2014-05-19 DIAGNOSIS — I4891 Unspecified atrial fibrillation: Secondary | ICD-10-CM | POA: Diagnosis not present

## 2014-05-20 DIAGNOSIS — Z5181 Encounter for therapeutic drug level monitoring: Secondary | ICD-10-CM | POA: Diagnosis not present

## 2014-05-20 DIAGNOSIS — Z471 Aftercare following joint replacement surgery: Secondary | ICD-10-CM | POA: Diagnosis not present

## 2014-05-20 DIAGNOSIS — Z7901 Long term (current) use of anticoagulants: Secondary | ICD-10-CM | POA: Diagnosis not present

## 2014-05-20 DIAGNOSIS — F17210 Nicotine dependence, cigarettes, uncomplicated: Secondary | ICD-10-CM | POA: Diagnosis not present

## 2014-05-20 DIAGNOSIS — I4891 Unspecified atrial fibrillation: Secondary | ICD-10-CM | POA: Diagnosis not present

## 2014-05-20 DIAGNOSIS — Z9181 History of falling: Secondary | ICD-10-CM | POA: Diagnosis not present

## 2014-05-21 DIAGNOSIS — Z9181 History of falling: Secondary | ICD-10-CM | POA: Diagnosis not present

## 2014-05-21 DIAGNOSIS — I4891 Unspecified atrial fibrillation: Secondary | ICD-10-CM | POA: Diagnosis not present

## 2014-05-21 DIAGNOSIS — F17210 Nicotine dependence, cigarettes, uncomplicated: Secondary | ICD-10-CM | POA: Diagnosis not present

## 2014-05-21 DIAGNOSIS — Z5181 Encounter for therapeutic drug level monitoring: Secondary | ICD-10-CM | POA: Diagnosis not present

## 2014-05-21 DIAGNOSIS — Z7901 Long term (current) use of anticoagulants: Secondary | ICD-10-CM | POA: Diagnosis not present

## 2014-05-21 DIAGNOSIS — Z471 Aftercare following joint replacement surgery: Secondary | ICD-10-CM | POA: Diagnosis not present

## 2014-05-23 DIAGNOSIS — Z5181 Encounter for therapeutic drug level monitoring: Secondary | ICD-10-CM | POA: Diagnosis not present

## 2014-05-23 DIAGNOSIS — I4891 Unspecified atrial fibrillation: Secondary | ICD-10-CM | POA: Diagnosis not present

## 2014-05-23 DIAGNOSIS — Z471 Aftercare following joint replacement surgery: Secondary | ICD-10-CM | POA: Diagnosis not present

## 2014-05-23 DIAGNOSIS — F17210 Nicotine dependence, cigarettes, uncomplicated: Secondary | ICD-10-CM | POA: Diagnosis not present

## 2014-05-23 DIAGNOSIS — Z9181 History of falling: Secondary | ICD-10-CM | POA: Diagnosis not present

## 2014-05-23 DIAGNOSIS — Z7901 Long term (current) use of anticoagulants: Secondary | ICD-10-CM | POA: Diagnosis not present

## 2014-05-24 DIAGNOSIS — M25561 Pain in right knee: Secondary | ICD-10-CM | POA: Diagnosis not present

## 2014-05-24 DIAGNOSIS — Z96651 Presence of right artificial knee joint: Secondary | ICD-10-CM | POA: Diagnosis not present

## 2014-05-25 DIAGNOSIS — Z96651 Presence of right artificial knee joint: Secondary | ICD-10-CM | POA: Diagnosis not present

## 2014-05-25 DIAGNOSIS — M62551 Muscle wasting and atrophy, not elsewhere classified, right thigh: Secondary | ICD-10-CM | POA: Diagnosis not present

## 2014-05-25 DIAGNOSIS — M25461 Effusion, right knee: Secondary | ICD-10-CM | POA: Diagnosis not present

## 2014-05-25 DIAGNOSIS — M25561 Pain in right knee: Secondary | ICD-10-CM | POA: Diagnosis not present

## 2014-05-27 DIAGNOSIS — M62551 Muscle wasting and atrophy, not elsewhere classified, right thigh: Secondary | ICD-10-CM | POA: Diagnosis not present

## 2014-05-27 DIAGNOSIS — M25561 Pain in right knee: Secondary | ICD-10-CM | POA: Diagnosis not present

## 2014-05-27 DIAGNOSIS — Z96651 Presence of right artificial knee joint: Secondary | ICD-10-CM | POA: Diagnosis not present

## 2014-05-27 DIAGNOSIS — M25461 Effusion, right knee: Secondary | ICD-10-CM | POA: Diagnosis not present

## 2014-05-30 DIAGNOSIS — Z96651 Presence of right artificial knee joint: Secondary | ICD-10-CM | POA: Diagnosis not present

## 2014-05-30 DIAGNOSIS — M25561 Pain in right knee: Secondary | ICD-10-CM | POA: Diagnosis not present

## 2014-05-30 DIAGNOSIS — M62551 Muscle wasting and atrophy, not elsewhere classified, right thigh: Secondary | ICD-10-CM | POA: Diagnosis not present

## 2014-05-30 DIAGNOSIS — M25461 Effusion, right knee: Secondary | ICD-10-CM | POA: Diagnosis not present

## 2014-05-31 NOTE — Discharge Summary (Signed)
SPORTS MEDICINE & JOINT REPLACEMENT   Lara Mulch, MD   Carlynn Spry, PA-C [address], [place], [place]  [id number]                             [phone]  PATIENT ID: Jeffrey Hammond        MRN:  [id number]          DOB/AGE: [birth year] / 74 y.o.    DISCHARGE SUMMARY  ADMISSION DATE:    05/09/2014 DISCHARGE DATE:   05/10/2014  ADMISSION DIAGNOSIS: primary osteoarthritis right knee    DISCHARGE DIAGNOSIS:  primary osteoarthritis right knee    ADDITIONAL DIAGNOSIS: Active Problems:   S/P total knee arthroplasty  Past Medical History  Diagnosis Date  . Dysrhythmia   . Hyperlipemia   . Dizziness   . Inner ear disease   . Varicose veins   . Anxiety   . GERD (gastroesophageal reflux disease)   . PONV (postoperative nausea and vomiting)     1 time 1960's-1970's  . OSA on CPAP   . Arthritis     "right knee" (05/09/2014)    PROCEDURE: Procedure(s): RIGHT TOTAL KNEE ARTHROPLASTY on 05/09/2014  CONSULTS:     HISTORY:  See H&P in chart  HOSPITAL COURSE:  Jeffrey Hammond is a 74 y.o. admitted on 05/09/2014 and found to have a diagnosis of primary osteoarthritis right knee.  After appropriate laboratory studies were obtained  they were taken to the operating room on 05/09/2014 and underwent Procedure(s): RIGHT TOTAL KNEE ARTHROPLASTY.   They were given perioperative antibiotics:  Anti-infectives    Start     Dose/Rate Route Frequency Ordered Stop   05/09/14 1415  ceFAZolin (ANCEF) IVPB 1 g/50 mL premix     1 g 100 mL/hr over 30 Minutes Intravenous Every 6 hours 05/09/14 1408 05/09/14 2017   05/09/14 0600  ceFAZolin (ANCEF) IVPB 2 g/50 mL premix     2 g 100 mL/hr over 30 Minutes Intravenous On call to O.R. 05/08/14 1413 05/09/14 0900    .  Tolerated the procedure well.  Placed with a foley intraoperatively.  Given Ofirmev at induction and for 48 hours.    POD# 1: Vital signs were stable.  Patient denied Chest pain, shortness of breath, or calf pain.  Patient was started  on Lovenox 30 mg subcutaneously twice daily at 8am.  Consults to PT, OT, and care management were made.  The patient was weight bearing as tolerated.  CPM was placed on the operative leg 0-90 degrees for 6-8 hours a day.  Incentive spirometry was taught.  Dressing was changed.  Hemovac was discontinued.      POD #2, Continued  PT for ambulation and exercise program.  IV saline locked.  O2 discontinued.    The remainder of the hospital course was dedicated to ambulation and strengthening.   The patient was discharged on 1 day post op in  Good condition.  Blood products given:none  DIAGNOSTIC STUDIES: Recent vital signs: No data found.      Recent laboratory studies: No results for input(s): WBC, HGB, HCT, PLT in the last 168 hours. No results for input(s): NA, K, CL, CO2, BUN, CREATININE, GLUCOSE, CALCIUM in the last 168 hours. Lab Results  Component Value Date   INR 1.23 05/10/2014   INR 1.02 05/09/2014     Recent Radiographic Studies :  No results found.  DISCHARGE INSTRUCTIONS: Discharge Instructions    CPM  Complete by:  As directed   Continuous passive motion machine (CPM):      Use the CPM from 0 to 90 for 6-8 hours per day.      You may increase by 10 per day.  You may break it up into 2 or 3 sessions per day.      Use CPM for 2 weeks or until you are told to stop.     Call MD / Call 911    Complete by:  As directed   If you experience chest pain or shortness of breath, CALL 911 and be transported to the hospital emergency room.  If you develope a fever above 101 F, pus (white drainage) or increased drainage or redness at the wound, or calf pain, call your surgeon's office.     Change dressing    Complete by:  As directed   Change dressing on wednesday, then change the dressing daily with sterile 4 x 4 inch gauze dressing and apply TED hose.     Constipation Prevention    Complete by:  As directed   Drink plenty of fluids.  Prune juice may be helpful.  You may use a  stool softener, such as Colace (over the counter) 100 mg twice a day.  Use MiraLax (over the counter) for constipation as needed.     Diet - low sodium heart healthy    Complete by:  As directed      Do not put a pillow under the knee. Place it under the heel.    Complete by:  As directed      Driving restrictions    Complete by:  As directed   No driving for 6 weeks     Increase activity slowly as tolerated    Complete by:  As directed      Lifting restrictions    Complete by:  As directed   No lifting for 6 weeks     TED hose    Complete by:  As directed   Use stockings (TED hose) for 3 weeks on both leg(s).  You may remove them at night for sleeping.           DISCHARGE MEDICATIONS:     Medication List    STOP taking these medications        Fish Oil 1000 MG Caps      TAKE these medications        acetaminophen 500 MG tablet  Commonly known as:  TYLENOL  Take 1,000 mg by mouth every 6 (six) hours as needed for mild pain.     atorvastatin 40 MG tablet  Commonly known as:  LIPITOR  Take 20 mg by mouth daily.     diphenhydrAMINE 25 MG tablet  Commonly known as:  BENADRYL  Take 25 mg by mouth every 6 (six) hours as needed for itching.     doxycycline 40 MG capsule  Commonly known as:  ORACEA  Take 40 mg by mouth daily.     enoxaparin 40 MG/0.4ML injection  Commonly known as:  LOVENOX  Inject 0.4 mLs (40 mg total) into the skin daily.     meclizine 25 MG tablet  Commonly known as:  ANTIVERT  Take 25 mg by mouth 3 (three) times daily as needed for dizziness.     methocarbamol 500 MG tablet  Commonly known as:  ROBAXIN  Take 1-2 tablets (500-1,000 mg total) by mouth 4 (four) times daily.     oxyCODONE  5 MG immediate release tablet  Commonly known as:  Oxy IR/ROXICODONE  Take 1-2 tablets (5-10 mg total) by mouth every 3 (three) hours as needed for breakthrough pain.     PARoxetine 10 MG tablet  Commonly known as:  PAXIL  Take 5 mg by mouth daily.      sildenafil 100 MG tablet  Commonly known as:  VIAGRA  Take 100 mg by mouth daily as needed for erectile dysfunction.     simethicone 80 MG chewable tablet  Commonly known as:  MYLICON  Chew 80 mg by mouth every 6 (six) hours as needed (Acid reflux).     warfarin 3 MG tablet  Commonly known as:  COUMADIN  Take 3 mg by mouth daily. Taken with 5mg  to equal 8mg  daily     warfarin 5 MG tablet  Commonly known as:  COUMADIN  Take 5 mg by mouth daily. Taken with 3mg  with equal 8mg  daily        FOLLOW UP VISIT:       Follow-up Information    Follow up with Rudean Haskell, MD. Call on 05/24/2014.   Specialty:  Orthopedic Surgery   Contact information:   [place] [id number] [phone]       Follow up with [facility].   Why:  They will contact you to schedule home therapy visits.    Contact information:   [address] [place] [address] [phone]       DISPOSITION: HOME   CONDITION:  Good   Leyli Kevorkian 05/31/2014, 3:36 PM

## 2014-06-01 DIAGNOSIS — M25461 Effusion, right knee: Secondary | ICD-10-CM | POA: Diagnosis not present

## 2014-06-01 DIAGNOSIS — Z96651 Presence of right artificial knee joint: Secondary | ICD-10-CM | POA: Diagnosis not present

## 2014-06-01 DIAGNOSIS — M25561 Pain in right knee: Secondary | ICD-10-CM | POA: Diagnosis not present

## 2014-06-01 DIAGNOSIS — M62551 Muscle wasting and atrophy, not elsewhere classified, right thigh: Secondary | ICD-10-CM | POA: Diagnosis not present

## 2014-06-03 DIAGNOSIS — Z96651 Presence of right artificial knee joint: Secondary | ICD-10-CM | POA: Diagnosis not present

## 2014-06-03 DIAGNOSIS — M25461 Effusion, right knee: Secondary | ICD-10-CM | POA: Diagnosis not present

## 2014-06-03 DIAGNOSIS — M25561 Pain in right knee: Secondary | ICD-10-CM | POA: Diagnosis not present

## 2014-06-03 DIAGNOSIS — M62551 Muscle wasting and atrophy, not elsewhere classified, right thigh: Secondary | ICD-10-CM | POA: Diagnosis not present

## 2014-06-06 DIAGNOSIS — Z96651 Presence of right artificial knee joint: Secondary | ICD-10-CM | POA: Diagnosis not present

## 2014-06-06 DIAGNOSIS — M25561 Pain in right knee: Secondary | ICD-10-CM | POA: Diagnosis not present

## 2014-06-06 DIAGNOSIS — M25461 Effusion, right knee: Secondary | ICD-10-CM | POA: Diagnosis not present

## 2014-06-06 DIAGNOSIS — M62551 Muscle wasting and atrophy, not elsewhere classified, right thigh: Secondary | ICD-10-CM | POA: Diagnosis not present

## 2014-06-08 DIAGNOSIS — Z96651 Presence of right artificial knee joint: Secondary | ICD-10-CM | POA: Diagnosis not present

## 2014-06-08 DIAGNOSIS — M25461 Effusion, right knee: Secondary | ICD-10-CM | POA: Diagnosis not present

## 2014-06-08 DIAGNOSIS — M62551 Muscle wasting and atrophy, not elsewhere classified, right thigh: Secondary | ICD-10-CM | POA: Diagnosis not present

## 2014-06-08 DIAGNOSIS — M25561 Pain in right knee: Secondary | ICD-10-CM | POA: Diagnosis not present

## 2014-06-10 DIAGNOSIS — Z96651 Presence of right artificial knee joint: Secondary | ICD-10-CM | POA: Diagnosis not present

## 2014-06-10 DIAGNOSIS — M62551 Muscle wasting and atrophy, not elsewhere classified, right thigh: Secondary | ICD-10-CM | POA: Diagnosis not present

## 2014-06-10 DIAGNOSIS — M25561 Pain in right knee: Secondary | ICD-10-CM | POA: Diagnosis not present

## 2014-06-10 DIAGNOSIS — M25461 Effusion, right knee: Secondary | ICD-10-CM | POA: Diagnosis not present

## 2014-06-14 DIAGNOSIS — M62551 Muscle wasting and atrophy, not elsewhere classified, right thigh: Secondary | ICD-10-CM | POA: Diagnosis not present

## 2014-06-14 DIAGNOSIS — M25461 Effusion, right knee: Secondary | ICD-10-CM | POA: Diagnosis not present

## 2014-06-14 DIAGNOSIS — Z96651 Presence of right artificial knee joint: Secondary | ICD-10-CM | POA: Diagnosis not present

## 2014-06-14 DIAGNOSIS — M25561 Pain in right knee: Secondary | ICD-10-CM | POA: Diagnosis not present

## 2014-06-16 DIAGNOSIS — M25561 Pain in right knee: Secondary | ICD-10-CM | POA: Diagnosis not present

## 2014-06-16 DIAGNOSIS — M25461 Effusion, right knee: Secondary | ICD-10-CM | POA: Diagnosis not present

## 2014-06-16 DIAGNOSIS — M62551 Muscle wasting and atrophy, not elsewhere classified, right thigh: Secondary | ICD-10-CM | POA: Diagnosis not present

## 2014-06-16 DIAGNOSIS — Z96651 Presence of right artificial knee joint: Secondary | ICD-10-CM | POA: Diagnosis not present

## 2014-06-20 DIAGNOSIS — I482 Chronic atrial fibrillation: Secondary | ICD-10-CM | POA: Diagnosis not present

## 2014-06-21 DIAGNOSIS — M25461 Effusion, right knee: Secondary | ICD-10-CM | POA: Diagnosis not present

## 2014-06-21 DIAGNOSIS — Z96651 Presence of right artificial knee joint: Secondary | ICD-10-CM | POA: Diagnosis not present

## 2014-06-21 DIAGNOSIS — M25561 Pain in right knee: Secondary | ICD-10-CM | POA: Diagnosis not present

## 2014-06-21 DIAGNOSIS — M62551 Muscle wasting and atrophy, not elsewhere classified, right thigh: Secondary | ICD-10-CM | POA: Diagnosis not present

## 2014-06-23 DIAGNOSIS — I482 Chronic atrial fibrillation: Secondary | ICD-10-CM | POA: Diagnosis not present

## 2014-06-23 DIAGNOSIS — Z6837 Body mass index (BMI) 37.0-37.9, adult: Secondary | ICD-10-CM | POA: Diagnosis not present

## 2014-06-24 DIAGNOSIS — M25461 Effusion, right knee: Secondary | ICD-10-CM | POA: Diagnosis not present

## 2014-06-24 DIAGNOSIS — Z96651 Presence of right artificial knee joint: Secondary | ICD-10-CM | POA: Diagnosis not present

## 2014-06-24 DIAGNOSIS — M25561 Pain in right knee: Secondary | ICD-10-CM | POA: Diagnosis not present

## 2014-06-24 DIAGNOSIS — M62551 Muscle wasting and atrophy, not elsewhere classified, right thigh: Secondary | ICD-10-CM | POA: Diagnosis not present

## 2014-06-27 DIAGNOSIS — M25461 Effusion, right knee: Secondary | ICD-10-CM | POA: Diagnosis not present

## 2014-06-27 DIAGNOSIS — M25561 Pain in right knee: Secondary | ICD-10-CM | POA: Diagnosis not present

## 2014-06-27 DIAGNOSIS — M62551 Muscle wasting and atrophy, not elsewhere classified, right thigh: Secondary | ICD-10-CM | POA: Diagnosis not present

## 2014-06-27 DIAGNOSIS — Z96651 Presence of right artificial knee joint: Secondary | ICD-10-CM | POA: Diagnosis not present

## 2014-06-29 DIAGNOSIS — M25461 Effusion, right knee: Secondary | ICD-10-CM | POA: Diagnosis not present

## 2014-06-29 DIAGNOSIS — M62551 Muscle wasting and atrophy, not elsewhere classified, right thigh: Secondary | ICD-10-CM | POA: Diagnosis not present

## 2014-06-29 DIAGNOSIS — M25561 Pain in right knee: Secondary | ICD-10-CM | POA: Diagnosis not present

## 2014-06-29 DIAGNOSIS — Z96651 Presence of right artificial knee joint: Secondary | ICD-10-CM | POA: Diagnosis not present

## 2014-06-30 DIAGNOSIS — I482 Chronic atrial fibrillation: Secondary | ICD-10-CM | POA: Diagnosis not present

## 2014-07-05 DIAGNOSIS — Z96651 Presence of right artificial knee joint: Secondary | ICD-10-CM | POA: Diagnosis not present

## 2014-07-05 DIAGNOSIS — M25561 Pain in right knee: Secondary | ICD-10-CM | POA: Diagnosis not present

## 2014-07-05 DIAGNOSIS — M62551 Muscle wasting and atrophy, not elsewhere classified, right thigh: Secondary | ICD-10-CM | POA: Diagnosis not present

## 2014-07-05 DIAGNOSIS — M25461 Effusion, right knee: Secondary | ICD-10-CM | POA: Diagnosis not present

## 2014-07-07 DIAGNOSIS — M62551 Muscle wasting and atrophy, not elsewhere classified, right thigh: Secondary | ICD-10-CM | POA: Diagnosis not present

## 2014-07-07 DIAGNOSIS — Z96651 Presence of right artificial knee joint: Secondary | ICD-10-CM | POA: Diagnosis not present

## 2014-07-07 DIAGNOSIS — M25561 Pain in right knee: Secondary | ICD-10-CM | POA: Diagnosis not present

## 2014-07-07 DIAGNOSIS — M25461 Effusion, right knee: Secondary | ICD-10-CM | POA: Diagnosis not present

## 2014-07-13 DIAGNOSIS — Z96651 Presence of right artificial knee joint: Secondary | ICD-10-CM | POA: Diagnosis not present

## 2014-07-13 DIAGNOSIS — M25561 Pain in right knee: Secondary | ICD-10-CM | POA: Diagnosis not present

## 2014-07-13 DIAGNOSIS — M25461 Effusion, right knee: Secondary | ICD-10-CM | POA: Diagnosis not present

## 2014-07-13 DIAGNOSIS — M62551 Muscle wasting and atrophy, not elsewhere classified, right thigh: Secondary | ICD-10-CM | POA: Diagnosis not present

## 2014-07-20 DIAGNOSIS — M25461 Effusion, right knee: Secondary | ICD-10-CM | POA: Diagnosis not present

## 2014-07-20 DIAGNOSIS — M62551 Muscle wasting and atrophy, not elsewhere classified, right thigh: Secondary | ICD-10-CM | POA: Diagnosis not present

## 2014-07-20 DIAGNOSIS — M25561 Pain in right knee: Secondary | ICD-10-CM | POA: Diagnosis not present

## 2014-07-20 DIAGNOSIS — Z96651 Presence of right artificial knee joint: Secondary | ICD-10-CM | POA: Diagnosis not present

## 2014-07-27 DIAGNOSIS — Z96651 Presence of right artificial knee joint: Secondary | ICD-10-CM | POA: Diagnosis not present

## 2014-07-27 DIAGNOSIS — M25461 Effusion, right knee: Secondary | ICD-10-CM | POA: Diagnosis not present

## 2014-07-27 DIAGNOSIS — M25561 Pain in right knee: Secondary | ICD-10-CM | POA: Diagnosis not present

## 2014-07-27 DIAGNOSIS — M62551 Muscle wasting and atrophy, not elsewhere classified, right thigh: Secondary | ICD-10-CM | POA: Diagnosis not present

## 2014-07-29 DIAGNOSIS — I482 Chronic atrial fibrillation: Secondary | ICD-10-CM | POA: Diagnosis not present

## 2014-08-01 DIAGNOSIS — M62551 Muscle wasting and atrophy, not elsewhere classified, right thigh: Secondary | ICD-10-CM | POA: Diagnosis not present

## 2014-08-01 DIAGNOSIS — M25461 Effusion, right knee: Secondary | ICD-10-CM | POA: Diagnosis not present

## 2014-08-01 DIAGNOSIS — M25561 Pain in right knee: Secondary | ICD-10-CM | POA: Diagnosis not present

## 2014-08-01 DIAGNOSIS — Z96651 Presence of right artificial knee joint: Secondary | ICD-10-CM | POA: Diagnosis not present

## 2014-08-03 DIAGNOSIS — M25561 Pain in right knee: Secondary | ICD-10-CM | POA: Diagnosis not present

## 2014-08-03 DIAGNOSIS — M62551 Muscle wasting and atrophy, not elsewhere classified, right thigh: Secondary | ICD-10-CM | POA: Diagnosis not present

## 2014-08-03 DIAGNOSIS — M25461 Effusion, right knee: Secondary | ICD-10-CM | POA: Diagnosis not present

## 2014-08-03 DIAGNOSIS — Z96651 Presence of right artificial knee joint: Secondary | ICD-10-CM | POA: Diagnosis not present

## 2014-09-01 DIAGNOSIS — I482 Chronic atrial fibrillation: Secondary | ICD-10-CM | POA: Diagnosis not present

## 2014-10-03 DIAGNOSIS — I482 Chronic atrial fibrillation: Secondary | ICD-10-CM | POA: Diagnosis not present

## 2014-11-03 DIAGNOSIS — I482 Chronic atrial fibrillation: Secondary | ICD-10-CM | POA: Diagnosis not present

## 2014-11-10 DIAGNOSIS — Z96651 Presence of right artificial knee joint: Secondary | ICD-10-CM | POA: Diagnosis not present

## 2014-12-05 DIAGNOSIS — I482 Chronic atrial fibrillation: Secondary | ICD-10-CM | POA: Diagnosis not present

## 2014-12-15 DIAGNOSIS — I723 Aneurysm of iliac artery: Secondary | ICD-10-CM | POA: Diagnosis not present

## 2014-12-15 DIAGNOSIS — I480 Paroxysmal atrial fibrillation: Secondary | ICD-10-CM | POA: Diagnosis not present

## 2014-12-15 DIAGNOSIS — I714 Abdominal aortic aneurysm, without rupture: Secondary | ICD-10-CM | POA: Diagnosis not present

## 2014-12-15 DIAGNOSIS — E785 Hyperlipidemia, unspecified: Secondary | ICD-10-CM | POA: Diagnosis not present

## 2015-01-04 DIAGNOSIS — Z23 Encounter for immunization: Secondary | ICD-10-CM | POA: Diagnosis not present

## 2015-01-04 DIAGNOSIS — I482 Chronic atrial fibrillation: Secondary | ICD-10-CM | POA: Diagnosis not present

## 2015-01-30 DIAGNOSIS — M5137 Other intervertebral disc degeneration, lumbosacral region: Secondary | ICD-10-CM | POA: Diagnosis not present

## 2015-01-30 DIAGNOSIS — M9903 Segmental and somatic dysfunction of lumbar region: Secondary | ICD-10-CM | POA: Diagnosis not present

## 2015-01-30 DIAGNOSIS — M9904 Segmental and somatic dysfunction of sacral region: Secondary | ICD-10-CM | POA: Diagnosis not present

## 2015-01-30 DIAGNOSIS — M5136 Other intervertebral disc degeneration, lumbar region: Secondary | ICD-10-CM | POA: Diagnosis not present

## 2015-01-30 DIAGNOSIS — M5442 Lumbago with sciatica, left side: Secondary | ICD-10-CM | POA: Diagnosis not present

## 2015-01-30 DIAGNOSIS — M546 Pain in thoracic spine: Secondary | ICD-10-CM | POA: Diagnosis not present

## 2015-02-02 DIAGNOSIS — I482 Chronic atrial fibrillation: Secondary | ICD-10-CM | POA: Diagnosis not present

## 2015-02-06 DIAGNOSIS — M5442 Lumbago with sciatica, left side: Secondary | ICD-10-CM | POA: Diagnosis not present

## 2015-02-06 DIAGNOSIS — M9903 Segmental and somatic dysfunction of lumbar region: Secondary | ICD-10-CM | POA: Diagnosis not present

## 2015-02-06 DIAGNOSIS — M5136 Other intervertebral disc degeneration, lumbar region: Secondary | ICD-10-CM | POA: Diagnosis not present

## 2015-02-06 DIAGNOSIS — M9904 Segmental and somatic dysfunction of sacral region: Secondary | ICD-10-CM | POA: Diagnosis not present

## 2015-02-06 DIAGNOSIS — M5137 Other intervertebral disc degeneration, lumbosacral region: Secondary | ICD-10-CM | POA: Diagnosis not present

## 2015-02-06 DIAGNOSIS — M546 Pain in thoracic spine: Secondary | ICD-10-CM | POA: Diagnosis not present

## 2015-02-15 DIAGNOSIS — Z139 Encounter for screening, unspecified: Secondary | ICD-10-CM | POA: Diagnosis not present

## 2015-02-15 DIAGNOSIS — Z1389 Encounter for screening for other disorder: Secondary | ICD-10-CM | POA: Diagnosis not present

## 2015-02-15 DIAGNOSIS — Z0000 Encounter for general adult medical examination without abnormal findings: Secondary | ICD-10-CM | POA: Diagnosis not present

## 2015-03-07 DIAGNOSIS — I482 Chronic atrial fibrillation: Secondary | ICD-10-CM | POA: Diagnosis not present

## 2015-04-06 DIAGNOSIS — I482 Chronic atrial fibrillation: Secondary | ICD-10-CM | POA: Diagnosis not present

## 2015-05-09 DIAGNOSIS — I482 Chronic atrial fibrillation: Secondary | ICD-10-CM | POA: Diagnosis not present

## 2015-06-06 DIAGNOSIS — I482 Chronic atrial fibrillation: Secondary | ICD-10-CM | POA: Diagnosis not present

## 2015-06-12 DIAGNOSIS — I480 Paroxysmal atrial fibrillation: Secondary | ICD-10-CM

## 2015-06-12 DIAGNOSIS — Z7901 Long term (current) use of anticoagulants: Secondary | ICD-10-CM

## 2015-06-12 HISTORY — DX: Long term (current) use of anticoagulants: Z79.01

## 2015-06-12 HISTORY — DX: Paroxysmal atrial fibrillation: I48.0

## 2015-06-14 DIAGNOSIS — Z6836 Body mass index (BMI) 36.0-36.9, adult: Secondary | ICD-10-CM | POA: Diagnosis not present

## 2015-06-14 DIAGNOSIS — I480 Paroxysmal atrial fibrillation: Secondary | ICD-10-CM | POA: Diagnosis not present

## 2015-06-14 DIAGNOSIS — E785 Hyperlipidemia, unspecified: Secondary | ICD-10-CM | POA: Diagnosis not present

## 2015-06-14 DIAGNOSIS — Z7901 Long term (current) use of anticoagulants: Secondary | ICD-10-CM | POA: Diagnosis not present

## 2015-06-29 DIAGNOSIS — K921 Melena: Secondary | ICD-10-CM | POA: Diagnosis not present

## 2015-07-02 DIAGNOSIS — Z7901 Long term (current) use of anticoagulants: Secondary | ICD-10-CM | POA: Diagnosis not present

## 2015-07-02 DIAGNOSIS — K921 Melena: Secondary | ICD-10-CM | POA: Diagnosis not present

## 2015-07-03 DIAGNOSIS — K5790 Diverticulosis of intestine, part unspecified, without perforation or abscess without bleeding: Secondary | ICD-10-CM | POA: Diagnosis not present

## 2015-07-03 DIAGNOSIS — D125 Benign neoplasm of sigmoid colon: Secondary | ICD-10-CM | POA: Diagnosis not present

## 2015-07-03 DIAGNOSIS — K529 Noninfective gastroenteritis and colitis, unspecified: Secondary | ICD-10-CM | POA: Diagnosis not present

## 2015-07-03 DIAGNOSIS — Z86010 Personal history of colonic polyps: Secondary | ICD-10-CM | POA: Diagnosis not present

## 2015-07-03 DIAGNOSIS — K921 Melena: Secondary | ICD-10-CM | POA: Diagnosis not present

## 2015-07-03 DIAGNOSIS — K635 Polyp of colon: Secondary | ICD-10-CM | POA: Diagnosis not present

## 2015-08-08 DIAGNOSIS — K5010 Crohn's disease of large intestine without complications: Secondary | ICD-10-CM | POA: Diagnosis not present

## 2015-11-02 DIAGNOSIS — I4891 Unspecified atrial fibrillation: Secondary | ICD-10-CM | POA: Diagnosis not present

## 2015-11-02 DIAGNOSIS — Z6836 Body mass index (BMI) 36.0-36.9, adult: Secondary | ICD-10-CM | POA: Diagnosis not present

## 2015-11-30 DIAGNOSIS — R7301 Impaired fasting glucose: Secondary | ICD-10-CM | POA: Diagnosis not present

## 2015-11-30 DIAGNOSIS — Z125 Encounter for screening for malignant neoplasm of prostate: Secondary | ICD-10-CM | POA: Diagnosis not present

## 2015-11-30 DIAGNOSIS — I482 Chronic atrial fibrillation: Secondary | ICD-10-CM | POA: Diagnosis not present

## 2015-11-30 DIAGNOSIS — F39 Unspecified mood [affective] disorder: Secondary | ICD-10-CM | POA: Diagnosis not present

## 2015-11-30 DIAGNOSIS — E782 Mixed hyperlipidemia: Secondary | ICD-10-CM | POA: Diagnosis not present

## 2015-12-05 DIAGNOSIS — L600 Ingrowing nail: Secondary | ICD-10-CM

## 2015-12-05 HISTORY — DX: Ingrowing nail: L60.0

## 2015-12-19 DIAGNOSIS — I723 Aneurysm of iliac artery: Secondary | ICD-10-CM | POA: Diagnosis not present

## 2015-12-19 DIAGNOSIS — I714 Abdominal aortic aneurysm, without rupture: Secondary | ICD-10-CM | POA: Diagnosis not present

## 2015-12-19 DIAGNOSIS — L600 Ingrowing nail: Secondary | ICD-10-CM | POA: Diagnosis not present

## 2016-01-02 DIAGNOSIS — F17210 Nicotine dependence, cigarettes, uncomplicated: Secondary | ICD-10-CM

## 2016-01-02 DIAGNOSIS — I714 Abdominal aortic aneurysm, without rupture, unspecified: Secondary | ICD-10-CM

## 2016-01-02 DIAGNOSIS — E782 Mixed hyperlipidemia: Secondary | ICD-10-CM

## 2016-01-02 HISTORY — DX: Mixed hyperlipidemia: E78.2

## 2016-01-02 HISTORY — DX: Abdominal aortic aneurysm, without rupture: I71.4

## 2016-01-02 HISTORY — DX: Nicotine dependence, cigarettes, uncomplicated: F17.210

## 2016-01-02 HISTORY — DX: Abdominal aortic aneurysm, without rupture, unspecified: I71.40

## 2016-01-08 DIAGNOSIS — Z23 Encounter for immunization: Secondary | ICD-10-CM | POA: Diagnosis not present

## 2016-01-08 DIAGNOSIS — I4891 Unspecified atrial fibrillation: Secondary | ICD-10-CM | POA: Diagnosis not present

## 2016-01-08 DIAGNOSIS — Z6837 Body mass index (BMI) 37.0-37.9, adult: Secondary | ICD-10-CM | POA: Diagnosis not present

## 2016-01-29 DIAGNOSIS — I4891 Unspecified atrial fibrillation: Secondary | ICD-10-CM | POA: Diagnosis not present

## 2016-01-29 DIAGNOSIS — Z6837 Body mass index (BMI) 37.0-37.9, adult: Secondary | ICD-10-CM | POA: Diagnosis not present

## 2016-01-30 DIAGNOSIS — K5010 Crohn's disease of large intestine without complications: Secondary | ICD-10-CM | POA: Diagnosis not present

## 2016-02-12 DIAGNOSIS — Z5181 Encounter for therapeutic drug level monitoring: Secondary | ICD-10-CM | POA: Diagnosis not present

## 2016-02-12 DIAGNOSIS — J040 Acute laryngitis: Secondary | ICD-10-CM | POA: Diagnosis not present

## 2016-02-12 DIAGNOSIS — Z7901 Long term (current) use of anticoagulants: Secondary | ICD-10-CM | POA: Diagnosis not present

## 2016-02-12 DIAGNOSIS — I4891 Unspecified atrial fibrillation: Secondary | ICD-10-CM | POA: Diagnosis not present

## 2016-02-27 DIAGNOSIS — I482 Chronic atrial fibrillation: Secondary | ICD-10-CM | POA: Diagnosis not present

## 2016-03-12 DIAGNOSIS — Z6837 Body mass index (BMI) 37.0-37.9, adult: Secondary | ICD-10-CM | POA: Diagnosis not present

## 2016-03-12 DIAGNOSIS — I4891 Unspecified atrial fibrillation: Secondary | ICD-10-CM | POA: Diagnosis not present

## 2016-04-22 DIAGNOSIS — Z0000 Encounter for general adult medical examination without abnormal findings: Secondary | ICD-10-CM | POA: Diagnosis not present

## 2016-04-22 DIAGNOSIS — Z6838 Body mass index (BMI) 38.0-38.9, adult: Secondary | ICD-10-CM | POA: Diagnosis not present

## 2016-04-22 DIAGNOSIS — H6060 Unspecified chronic otitis externa, unspecified ear: Secondary | ICD-10-CM | POA: Diagnosis not present

## 2016-04-22 DIAGNOSIS — I4891 Unspecified atrial fibrillation: Secondary | ICD-10-CM | POA: Diagnosis not present

## 2016-04-30 DIAGNOSIS — L820 Inflamed seborrheic keratosis: Secondary | ICD-10-CM | POA: Diagnosis not present

## 2016-05-17 DIAGNOSIS — Z7901 Long term (current) use of anticoagulants: Secondary | ICD-10-CM | POA: Diagnosis not present

## 2016-05-17 DIAGNOSIS — I4891 Unspecified atrial fibrillation: Secondary | ICD-10-CM | POA: Diagnosis not present

## 2016-05-17 DIAGNOSIS — E782 Mixed hyperlipidemia: Secondary | ICD-10-CM | POA: Diagnosis not present

## 2016-05-17 DIAGNOSIS — R7301 Impaired fasting glucose: Secondary | ICD-10-CM | POA: Diagnosis not present

## 2016-06-12 DIAGNOSIS — H524 Presbyopia: Secondary | ICD-10-CM | POA: Diagnosis not present

## 2016-06-12 DIAGNOSIS — H26492 Other secondary cataract, left eye: Secondary | ICD-10-CM | POA: Diagnosis not present

## 2016-06-12 DIAGNOSIS — H5203 Hypermetropia, bilateral: Secondary | ICD-10-CM | POA: Diagnosis not present

## 2016-06-12 DIAGNOSIS — H52223 Regular astigmatism, bilateral: Secondary | ICD-10-CM | POA: Diagnosis not present

## 2016-06-14 DIAGNOSIS — I4891 Unspecified atrial fibrillation: Secondary | ICD-10-CM | POA: Diagnosis not present

## 2016-06-14 DIAGNOSIS — F39 Unspecified mood [affective] disorder: Secondary | ICD-10-CM | POA: Diagnosis not present

## 2016-06-14 DIAGNOSIS — E782 Mixed hyperlipidemia: Secondary | ICD-10-CM | POA: Diagnosis not present

## 2016-06-14 DIAGNOSIS — R7303 Prediabetes: Secondary | ICD-10-CM | POA: Diagnosis not present

## 2016-06-25 DIAGNOSIS — H26491 Other secondary cataract, right eye: Secondary | ICD-10-CM | POA: Diagnosis not present

## 2016-06-27 DIAGNOSIS — E785 Hyperlipidemia, unspecified: Secondary | ICD-10-CM | POA: Diagnosis not present

## 2016-06-27 DIAGNOSIS — Z6837 Body mass index (BMI) 37.0-37.9, adult: Secondary | ICD-10-CM | POA: Diagnosis not present

## 2016-06-27 DIAGNOSIS — Z7901 Long term (current) use of anticoagulants: Secondary | ICD-10-CM | POA: Diagnosis not present

## 2016-06-27 DIAGNOSIS — I480 Paroxysmal atrial fibrillation: Secondary | ICD-10-CM | POA: Diagnosis not present

## 2016-07-22 DIAGNOSIS — Z6838 Body mass index (BMI) 38.0-38.9, adult: Secondary | ICD-10-CM | POA: Diagnosis not present

## 2016-07-22 DIAGNOSIS — I4891 Unspecified atrial fibrillation: Secondary | ICD-10-CM | POA: Diagnosis not present

## 2016-08-02 DIAGNOSIS — K5010 Crohn's disease of large intestine without complications: Secondary | ICD-10-CM | POA: Diagnosis not present

## 2016-08-13 DIAGNOSIS — K5010 Crohn's disease of large intestine without complications: Secondary | ICD-10-CM | POA: Diagnosis not present

## 2016-08-22 DIAGNOSIS — M5137 Other intervertebral disc degeneration, lumbosacral region: Secondary | ICD-10-CM | POA: Diagnosis not present

## 2016-08-22 DIAGNOSIS — M546 Pain in thoracic spine: Secondary | ICD-10-CM | POA: Diagnosis not present

## 2016-08-22 DIAGNOSIS — M9904 Segmental and somatic dysfunction of sacral region: Secondary | ICD-10-CM | POA: Diagnosis not present

## 2016-08-22 DIAGNOSIS — M9903 Segmental and somatic dysfunction of lumbar region: Secondary | ICD-10-CM | POA: Diagnosis not present

## 2016-08-22 DIAGNOSIS — M5442 Lumbago with sciatica, left side: Secondary | ICD-10-CM | POA: Diagnosis not present

## 2016-08-22 DIAGNOSIS — M5136 Other intervertebral disc degeneration, lumbar region: Secondary | ICD-10-CM | POA: Diagnosis not present

## 2016-08-26 DIAGNOSIS — Z5181 Encounter for therapeutic drug level monitoring: Secondary | ICD-10-CM | POA: Diagnosis not present

## 2016-08-26 DIAGNOSIS — Z6838 Body mass index (BMI) 38.0-38.9, adult: Secondary | ICD-10-CM | POA: Diagnosis not present

## 2016-08-26 DIAGNOSIS — I4891 Unspecified atrial fibrillation: Secondary | ICD-10-CM | POA: Diagnosis not present

## 2016-08-26 DIAGNOSIS — Z7901 Long term (current) use of anticoagulants: Secondary | ICD-10-CM | POA: Diagnosis not present

## 2016-09-26 DIAGNOSIS — F39 Unspecified mood [affective] disorder: Secondary | ICD-10-CM | POA: Diagnosis not present

## 2016-09-26 DIAGNOSIS — I4891 Unspecified atrial fibrillation: Secondary | ICD-10-CM | POA: Diagnosis not present

## 2016-09-26 DIAGNOSIS — Z5181 Encounter for therapeutic drug level monitoring: Secondary | ICD-10-CM | POA: Diagnosis not present

## 2016-09-26 DIAGNOSIS — Z7901 Long term (current) use of anticoagulants: Secondary | ICD-10-CM | POA: Diagnosis not present

## 2016-09-26 DIAGNOSIS — R0602 Shortness of breath: Secondary | ICD-10-CM | POA: Diagnosis not present

## 2016-09-26 DIAGNOSIS — F3160 Bipolar disorder, current episode mixed, unspecified: Secondary | ICD-10-CM | POA: Diagnosis not present

## 2016-10-28 IMAGING — CR DG CHEST 2V
2 series · 2 of 2 positions shown · non-contrast
Comparison: None.

CLINICAL DATA: Preoperative examination prior to total right knee
arthroplasty; history of sleep apnea, cardiac dysrhythmia, reflux,
and long-term smoking.

EXAM:
CHEST  2 VIEW

[w chest pa]
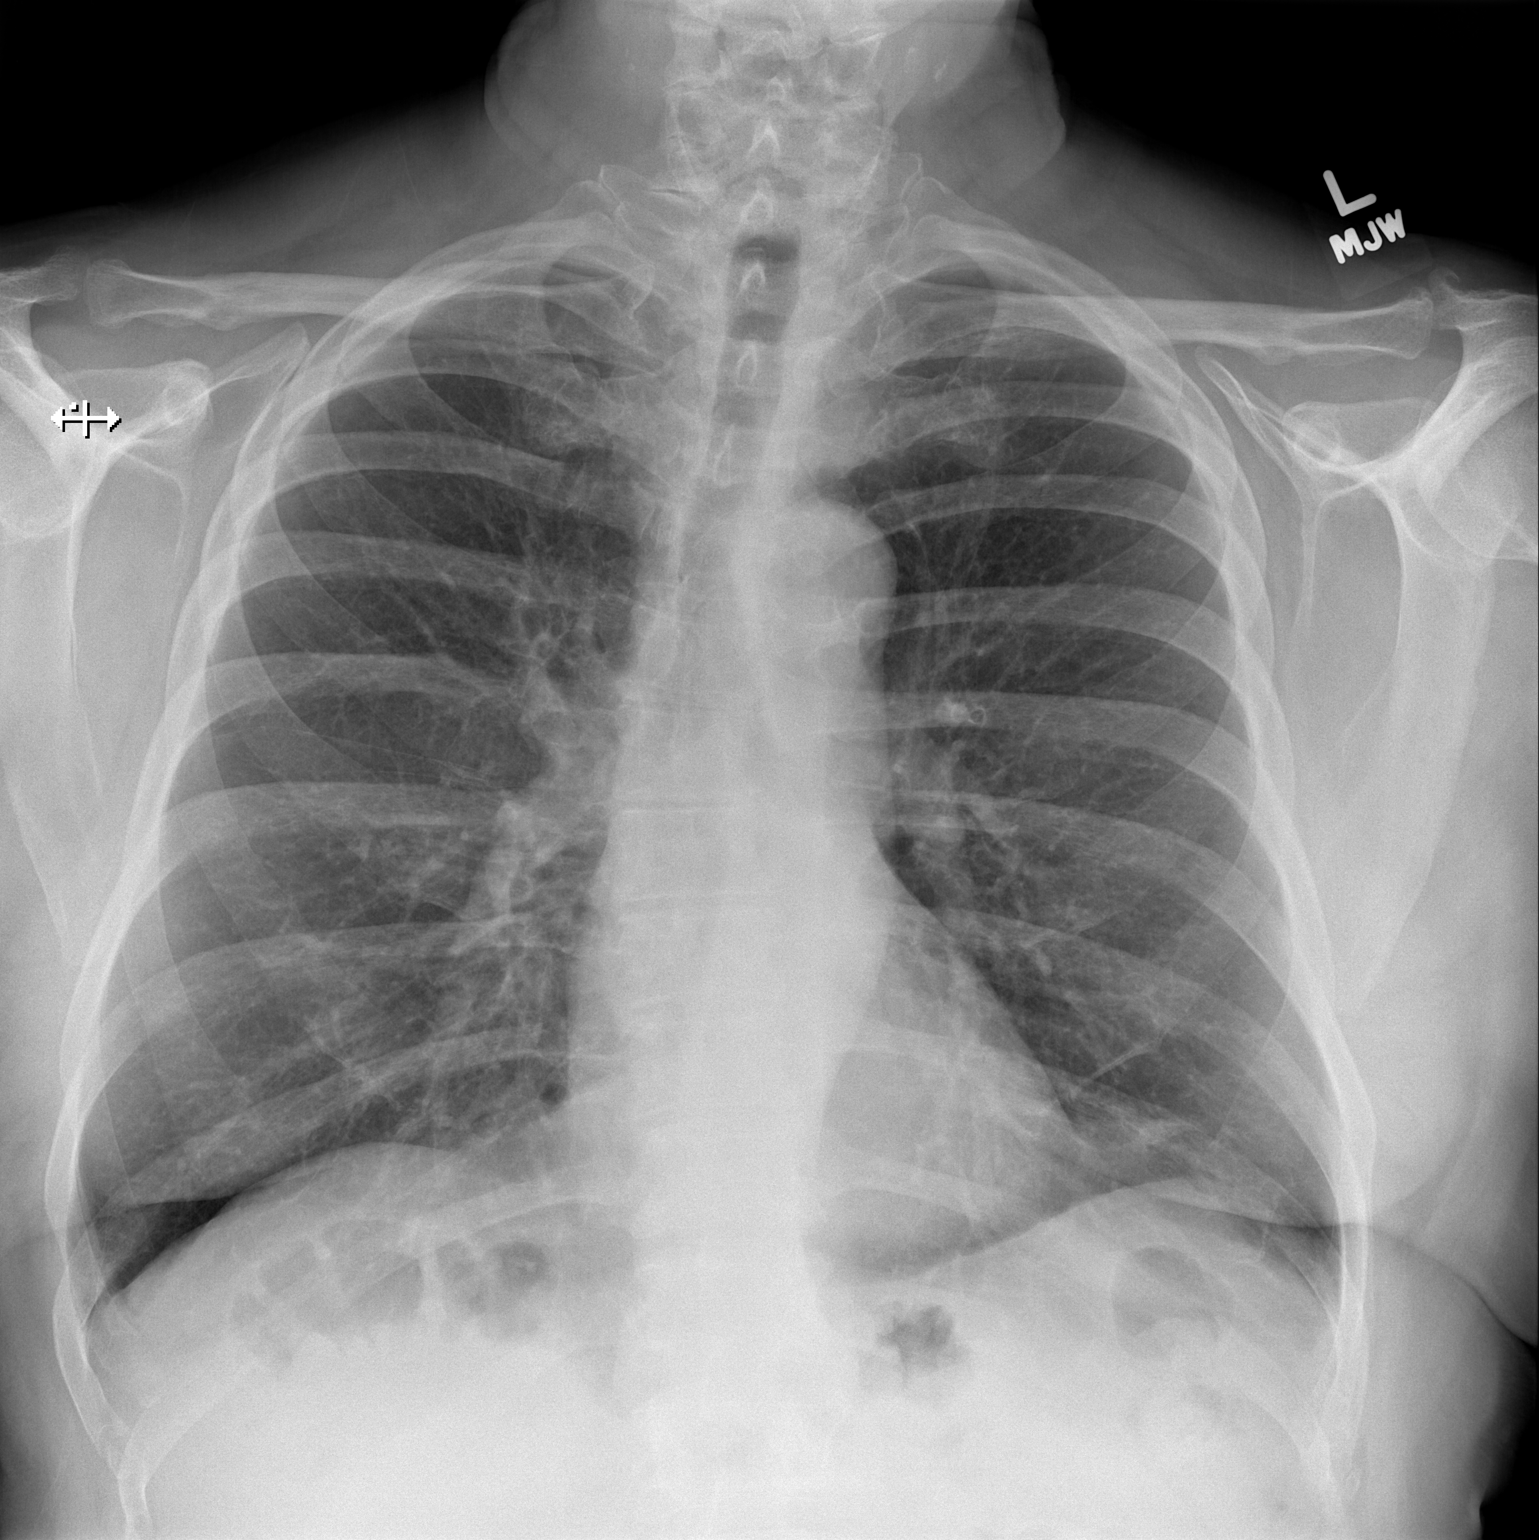

[w chest lat]
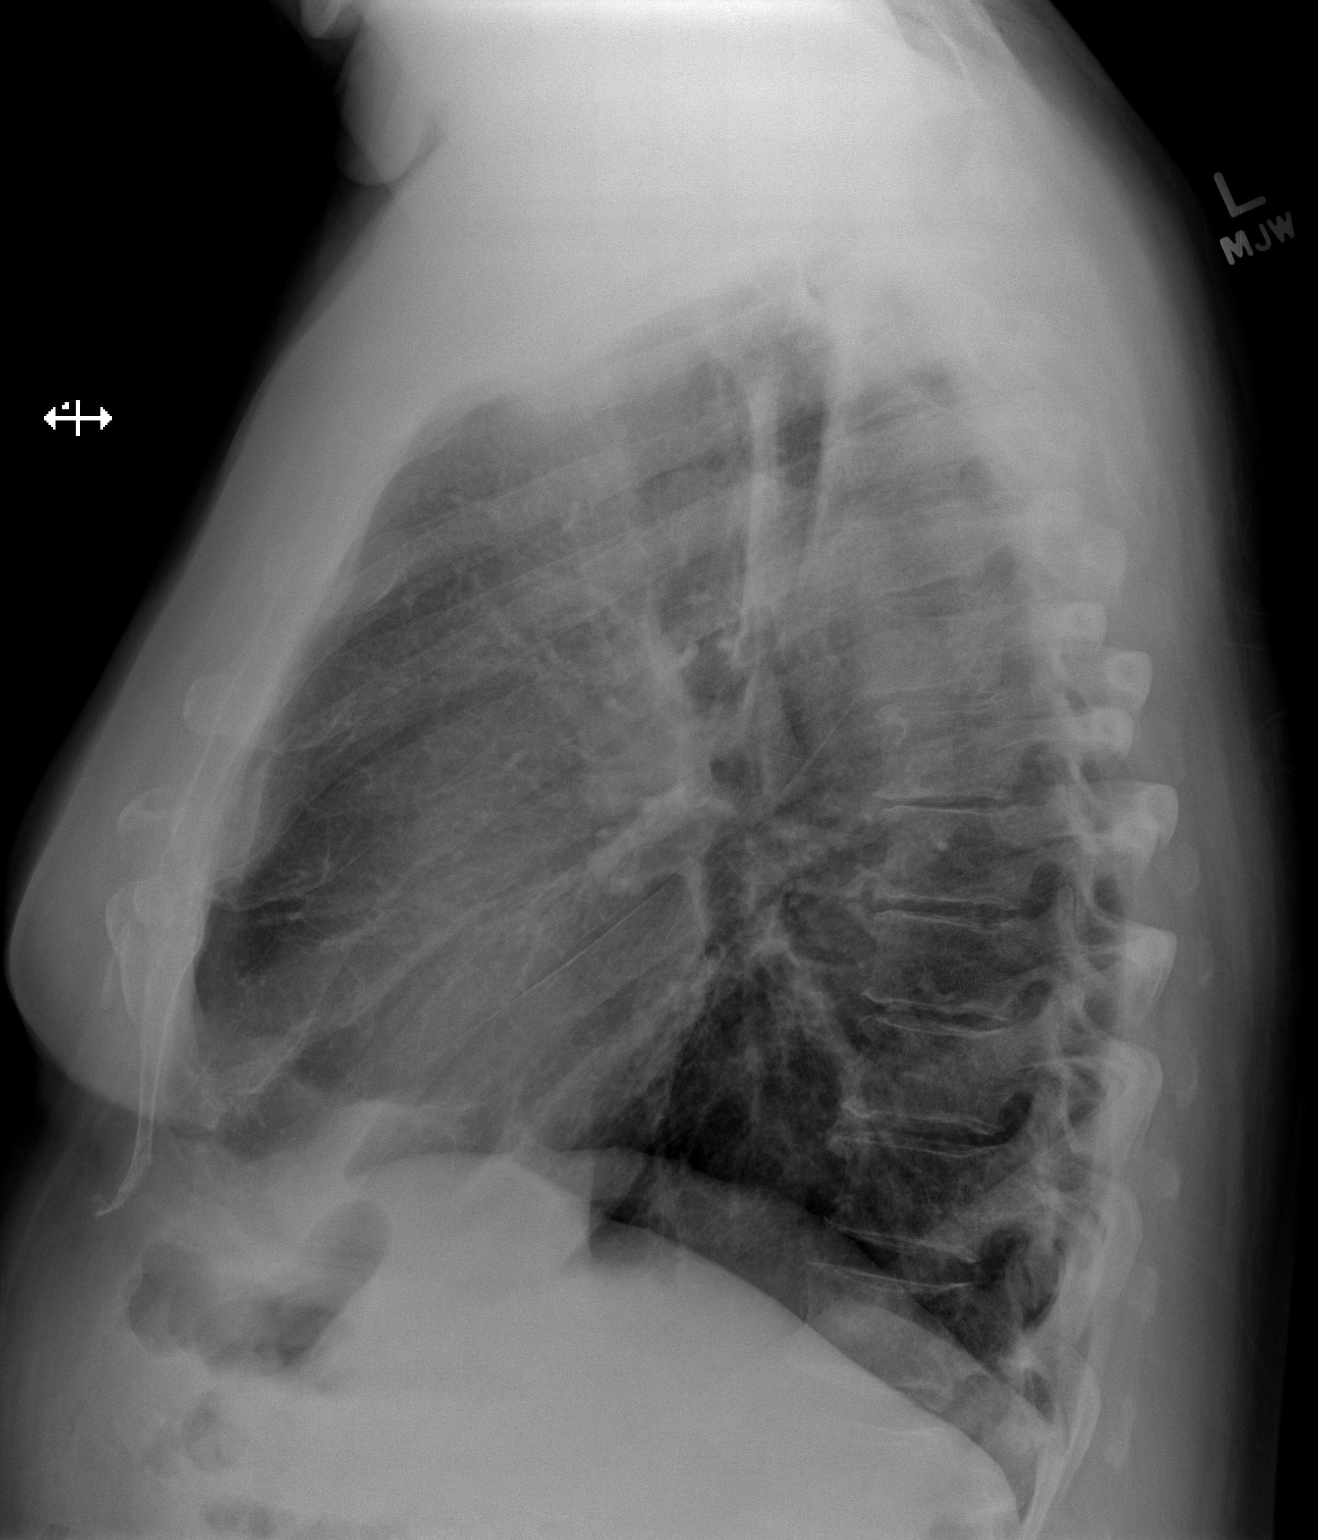

[2 of 2 positions shown; findings below may reference images not displayed]

FINDINGS: The lungs are well-expanded. The interstitial markings are coarse.
Linear subsegmental atelectasis or scarring in the left lower hemi
thorax is present. The heart and pulmonary vascularity are normal.
There is mild tortuosity of the descending thoracic aorta. There is
no pleural effusion. There is mild degenerative disc disease of the
thoracic spine.
IMPRESSION: COPD.  There is no active cardiopulmonary disease.

## 2016-10-31 DIAGNOSIS — Z6836 Body mass index (BMI) 36.0-36.9, adult: Secondary | ICD-10-CM | POA: Diagnosis not present

## 2016-10-31 DIAGNOSIS — Z5181 Encounter for therapeutic drug level monitoring: Secondary | ICD-10-CM | POA: Diagnosis not present

## 2016-10-31 DIAGNOSIS — Z7901 Long term (current) use of anticoagulants: Secondary | ICD-10-CM | POA: Diagnosis not present

## 2016-10-31 DIAGNOSIS — I4891 Unspecified atrial fibrillation: Secondary | ICD-10-CM | POA: Diagnosis not present

## 2016-12-02 DIAGNOSIS — Z7901 Long term (current) use of anticoagulants: Secondary | ICD-10-CM | POA: Diagnosis not present

## 2016-12-02 DIAGNOSIS — Z7189 Other specified counseling: Secondary | ICD-10-CM | POA: Diagnosis not present

## 2016-12-02 DIAGNOSIS — Z5181 Encounter for therapeutic drug level monitoring: Secondary | ICD-10-CM | POA: Diagnosis not present

## 2016-12-02 DIAGNOSIS — I4891 Unspecified atrial fibrillation: Secondary | ICD-10-CM | POA: Diagnosis not present

## 2016-12-03 DIAGNOSIS — M5442 Lumbago with sciatica, left side: Secondary | ICD-10-CM | POA: Diagnosis not present

## 2016-12-03 DIAGNOSIS — M9903 Segmental and somatic dysfunction of lumbar region: Secondary | ICD-10-CM | POA: Diagnosis not present

## 2016-12-03 DIAGNOSIS — M9904 Segmental and somatic dysfunction of sacral region: Secondary | ICD-10-CM | POA: Diagnosis not present

## 2016-12-03 DIAGNOSIS — M546 Pain in thoracic spine: Secondary | ICD-10-CM | POA: Diagnosis not present

## 2016-12-03 DIAGNOSIS — M5137 Other intervertebral disc degeneration, lumbosacral region: Secondary | ICD-10-CM | POA: Diagnosis not present

## 2016-12-03 DIAGNOSIS — M9901 Segmental and somatic dysfunction of cervical region: Secondary | ICD-10-CM | POA: Diagnosis not present

## 2016-12-03 DIAGNOSIS — M50323 Other cervical disc degeneration at C6-C7 level: Secondary | ICD-10-CM | POA: Diagnosis not present

## 2016-12-03 DIAGNOSIS — M5136 Other intervertebral disc degeneration, lumbar region: Secondary | ICD-10-CM | POA: Diagnosis not present

## 2016-12-03 DIAGNOSIS — M5413 Radiculopathy, cervicothoracic region: Secondary | ICD-10-CM | POA: Diagnosis not present

## 2016-12-16 DIAGNOSIS — M5413 Radiculopathy, cervicothoracic region: Secondary | ICD-10-CM | POA: Diagnosis not present

## 2016-12-16 DIAGNOSIS — M5136 Other intervertebral disc degeneration, lumbar region: Secondary | ICD-10-CM | POA: Diagnosis not present

## 2016-12-16 DIAGNOSIS — M5442 Lumbago with sciatica, left side: Secondary | ICD-10-CM | POA: Diagnosis not present

## 2016-12-16 DIAGNOSIS — M5137 Other intervertebral disc degeneration, lumbosacral region: Secondary | ICD-10-CM | POA: Diagnosis not present

## 2016-12-16 DIAGNOSIS — M9903 Segmental and somatic dysfunction of lumbar region: Secondary | ICD-10-CM | POA: Diagnosis not present

## 2016-12-16 DIAGNOSIS — M9904 Segmental and somatic dysfunction of sacral region: Secondary | ICD-10-CM | POA: Diagnosis not present

## 2016-12-16 DIAGNOSIS — M50323 Other cervical disc degeneration at C6-C7 level: Secondary | ICD-10-CM | POA: Diagnosis not present

## 2016-12-16 DIAGNOSIS — M546 Pain in thoracic spine: Secondary | ICD-10-CM | POA: Diagnosis not present

## 2016-12-16 DIAGNOSIS — M9901 Segmental and somatic dysfunction of cervical region: Secondary | ICD-10-CM | POA: Diagnosis not present

## 2017-01-01 DIAGNOSIS — Z7901 Long term (current) use of anticoagulants: Secondary | ICD-10-CM | POA: Diagnosis not present

## 2017-01-01 DIAGNOSIS — I4891 Unspecified atrial fibrillation: Secondary | ICD-10-CM | POA: Diagnosis not present

## 2017-01-01 DIAGNOSIS — Z5181 Encounter for therapeutic drug level monitoring: Secondary | ICD-10-CM | POA: Diagnosis not present

## 2017-01-01 DIAGNOSIS — Z23 Encounter for immunization: Secondary | ICD-10-CM | POA: Diagnosis not present

## 2017-01-02 DIAGNOSIS — F17210 Nicotine dependence, cigarettes, uncomplicated: Secondary | ICD-10-CM | POA: Diagnosis not present

## 2017-01-02 DIAGNOSIS — E782 Mixed hyperlipidemia: Secondary | ICD-10-CM | POA: Diagnosis not present

## 2017-01-02 DIAGNOSIS — I714 Abdominal aortic aneurysm, without rupture: Secondary | ICD-10-CM | POA: Diagnosis not present

## 2017-01-02 DIAGNOSIS — I723 Aneurysm of iliac artery: Secondary | ICD-10-CM | POA: Insufficient documentation

## 2017-01-02 HISTORY — DX: Aneurysm of iliac artery: I72.3

## 2017-01-31 DIAGNOSIS — Z6836 Body mass index (BMI) 36.0-36.9, adult: Secondary | ICD-10-CM | POA: Diagnosis not present

## 2017-01-31 DIAGNOSIS — Z7901 Long term (current) use of anticoagulants: Secondary | ICD-10-CM | POA: Diagnosis not present

## 2017-01-31 DIAGNOSIS — Z5181 Encounter for therapeutic drug level monitoring: Secondary | ICD-10-CM | POA: Diagnosis not present

## 2017-01-31 DIAGNOSIS — I482 Chronic atrial fibrillation: Secondary | ICD-10-CM | POA: Diagnosis not present

## 2017-02-10 DIAGNOSIS — M50323 Other cervical disc degeneration at C6-C7 level: Secondary | ICD-10-CM | POA: Diagnosis not present

## 2017-02-10 DIAGNOSIS — M9903 Segmental and somatic dysfunction of lumbar region: Secondary | ICD-10-CM | POA: Diagnosis not present

## 2017-02-10 DIAGNOSIS — M9904 Segmental and somatic dysfunction of sacral region: Secondary | ICD-10-CM | POA: Diagnosis not present

## 2017-02-10 DIAGNOSIS — M5442 Lumbago with sciatica, left side: Secondary | ICD-10-CM | POA: Diagnosis not present

## 2017-02-10 DIAGNOSIS — M546 Pain in thoracic spine: Secondary | ICD-10-CM | POA: Diagnosis not present

## 2017-02-10 DIAGNOSIS — M5136 Other intervertebral disc degeneration, lumbar region: Secondary | ICD-10-CM | POA: Diagnosis not present

## 2017-02-10 DIAGNOSIS — M5137 Other intervertebral disc degeneration, lumbosacral region: Secondary | ICD-10-CM | POA: Diagnosis not present

## 2017-02-10 DIAGNOSIS — M9901 Segmental and somatic dysfunction of cervical region: Secondary | ICD-10-CM | POA: Diagnosis not present

## 2017-02-10 DIAGNOSIS — M5413 Radiculopathy, cervicothoracic region: Secondary | ICD-10-CM | POA: Diagnosis not present

## 2017-03-07 DIAGNOSIS — Z5181 Encounter for therapeutic drug level monitoring: Secondary | ICD-10-CM | POA: Diagnosis not present

## 2017-03-07 DIAGNOSIS — Z7901 Long term (current) use of anticoagulants: Secondary | ICD-10-CM | POA: Diagnosis not present

## 2017-03-07 DIAGNOSIS — I482 Chronic atrial fibrillation: Secondary | ICD-10-CM | POA: Diagnosis not present

## 2017-03-31 DIAGNOSIS — I482 Chronic atrial fibrillation: Secondary | ICD-10-CM | POA: Diagnosis not present

## 2017-03-31 DIAGNOSIS — E782 Mixed hyperlipidemia: Secondary | ICD-10-CM | POA: Diagnosis not present

## 2017-03-31 DIAGNOSIS — Z125 Encounter for screening for malignant neoplasm of prostate: Secondary | ICD-10-CM | POA: Diagnosis not present

## 2017-04-29 DIAGNOSIS — Z6837 Body mass index (BMI) 37.0-37.9, adult: Secondary | ICD-10-CM | POA: Diagnosis not present

## 2017-04-29 DIAGNOSIS — R0602 Shortness of breath: Secondary | ICD-10-CM | POA: Diagnosis not present

## 2017-04-29 DIAGNOSIS — J449 Chronic obstructive pulmonary disease, unspecified: Secondary | ICD-10-CM | POA: Diagnosis not present

## 2017-04-29 DIAGNOSIS — R062 Wheezing: Secondary | ICD-10-CM | POA: Diagnosis not present

## 2017-04-29 DIAGNOSIS — I700 Atherosclerosis of aorta: Secondary | ICD-10-CM | POA: Diagnosis not present

## 2017-04-29 DIAGNOSIS — R05 Cough: Secondary | ICD-10-CM | POA: Diagnosis not present

## 2017-05-02 DIAGNOSIS — Z9181 History of falling: Secondary | ICD-10-CM | POA: Diagnosis not present

## 2017-05-02 DIAGNOSIS — R9389 Abnormal findings on diagnostic imaging of other specified body structures: Secondary | ICD-10-CM | POA: Diagnosis not present

## 2017-05-02 DIAGNOSIS — E782 Mixed hyperlipidemia: Secondary | ICD-10-CM | POA: Diagnosis not present

## 2017-05-02 DIAGNOSIS — I4891 Unspecified atrial fibrillation: Secondary | ICD-10-CM | POA: Diagnosis not present

## 2017-05-02 DIAGNOSIS — Z139 Encounter for screening, unspecified: Secondary | ICD-10-CM | POA: Diagnosis not present

## 2017-05-02 DIAGNOSIS — R7303 Prediabetes: Secondary | ICD-10-CM | POA: Diagnosis not present

## 2017-05-02 DIAGNOSIS — Z1331 Encounter for screening for depression: Secondary | ICD-10-CM | POA: Diagnosis not present

## 2017-05-02 DIAGNOSIS — Z0000 Encounter for general adult medical examination without abnormal findings: Secondary | ICD-10-CM | POA: Diagnosis not present

## 2017-05-21 DIAGNOSIS — R972 Elevated prostate specific antigen [PSA]: Secondary | ICD-10-CM | POA: Diagnosis not present

## 2017-05-21 DIAGNOSIS — N401 Enlarged prostate with lower urinary tract symptoms: Secondary | ICD-10-CM | POA: Diagnosis not present

## 2017-05-27 DIAGNOSIS — R9389 Abnormal findings on diagnostic imaging of other specified body structures: Secondary | ICD-10-CM | POA: Diagnosis not present

## 2017-05-27 DIAGNOSIS — Z5181 Encounter for therapeutic drug level monitoring: Secondary | ICD-10-CM | POA: Diagnosis not present

## 2017-05-27 DIAGNOSIS — Z7901 Long term (current) use of anticoagulants: Secondary | ICD-10-CM | POA: Diagnosis not present

## 2017-05-27 DIAGNOSIS — I4891 Unspecified atrial fibrillation: Secondary | ICD-10-CM | POA: Diagnosis not present

## 2017-06-02 DIAGNOSIS — I8392 Asymptomatic varicose veins of left lower extremity: Secondary | ICD-10-CM

## 2017-06-02 DIAGNOSIS — I517 Cardiomegaly: Secondary | ICD-10-CM

## 2017-06-02 DIAGNOSIS — G4730 Sleep apnea, unspecified: Secondary | ICD-10-CM | POA: Insufficient documentation

## 2017-06-02 DIAGNOSIS — R7301 Impaired fasting glucose: Secondary | ICD-10-CM | POA: Insufficient documentation

## 2017-06-02 HISTORY — DX: Impaired fasting glucose: R73.01

## 2017-06-02 HISTORY — DX: Sleep apnea, unspecified: G47.30

## 2017-06-02 HISTORY — DX: Asymptomatic varicose veins of left lower extremity: I83.92

## 2017-06-02 HISTORY — DX: Cardiomegaly: I51.7

## 2017-06-10 NOTE — Progress Notes (Signed)
Cardiology Office Note:    Date:  06/11/2017   ID:  Jeffrey Hammond, DOB May 11, 1940, MRN [id number]  PCP:  Maryella Shivers, MD  Cardiologist:  Shirlee More, MD    Referring MD: Maryella Shivers, MD    ASSESSMENT:    1. PAF (paroxysmal atrial fibrillation) ([place])   2. Chronic anticoagulation   3. Mixed hyperlipidemia   4. AAA (abdominal aortic aneurysm) without rupture ([facility])    PLAN:    In order of problems listed above:  1. Stable he remains in atrial fibrillation and has had no recurrence and is considering transition from warfarin to Eliquis and will discuss with pharmacy regarding cost. 2. Continue his current anticoagulant for now goal INR 2.5 3. Stable continue his high intensity statin and request recent lipid profile from his PCP along with liver function 4. Stable followed by vascular surgery at [facility]   Next appointment: One year   Medication Adjustments/Labs and Tests Ordered: Current medicines are reviewed at length with the patient today.  Concerns regarding medicines are outlined above.  No orders of the defined types were placed in this encounter.  No orders of the defined types were placed in this encounter.   Chief Complaint  Patient presents with  . Follow-up  . Atrial Fibrillation  . Anticoagulation    History of Present Illness:    Jeffrey Hammond is a 77 y.o. male with a hx of Atrial Fibrillation on warfarin, Dyslipidemia  last seen one year ago..he has a small AAA and bilateral iliac artery enlargement followed by vascular surgery Compliance with diet, lifestyle and medications: Yes He is pleased with the quality of his life is had no chest pain palpitations syncope TIA or bleeding complications with warfarin.  He is considering transition to a direct anticoagulant. Past Medical History:  Diagnosis Date  . AAA (abdominal aortic aneurysm) without rupture ([place]) 01/02/2016  . Aneurysm of iliac artery ([facility]) 01/02/2017  . Anxiety   . Arthritis    "right knee" (05/09/2014)  . Chronic anticoagulation 06/12/2015  . Cigarette smoker 01/02/2016  . Dizziness   . Dysrhythmia   . GERD (gastroesophageal reflux disease)   . Hyperlipemia   . Impaired fasting glucose 06/02/2017  . Ingrown nail 12/05/2015  . Inner ear disease   . LAE (left atrial enlargement) 06/02/2017  . Mixed hyperlipidemia 01/02/2016  . OSA on CPAP   . PAF (paroxysmal atrial fibrillation) ([facility]) 06/12/2015   Overview:  CHADS2 vasc score= 1  . PONV (postoperative nausea and vomiting)    1 time 1960's-1970's  . Right knee pain 04/07/2014  . Sleep apnea 06/02/2017  . Status post total right knee replacement 05/09/2014  . Varicose veins   . Varicose veins of left lower extremity 06/02/2017    Past Surgical History:  Procedure Laterality Date  . CATARACT EXTRACTION W/ INTRAOCULAR LENS  IMPLANT, BILATERAL Bilateral 2015  . COLONOSCOPY    . JOINT REPLACEMENT    . PILONIDAL CYST EXCISION  ~ 1975  . TONSILLECTOMY  1964  . TOTAL KNEE ARTHROPLASTY Right 05/09/2014  . TOTAL KNEE ARTHROPLASTY Right 05/09/2014   Procedure: RIGHT TOTAL KNEE ARTHROPLASTY;  Surgeon: Vickey Huger, MD;  Location: [facility];  Service: Orthopedics;  Laterality: Right;    Current Medications: Current Meds  Medication Sig  . atorvastatin (LIPITOR) 20 MG tablet Take 20 mg by mouth daily.  . meclizine (ANTIVERT) 25 MG tablet Take 25 mg by mouth 3 (three) times daily as needed for dizziness.  . mesalamine (APRISO) 0.375  g 24 hr capsule Take 1,400 mg by mouth daily.  Marland Kitchen PARoxetine (PAXIL) 10 MG tablet Take 5 mg by mouth daily.  Marland Kitchen warfarin (COUMADIN) 2 MG tablet Take 2 mg by mouth daily. Taken with 5mg  to equal 8mg  daily   . warfarin (COUMADIN) 5 MG tablet Take 5 mg by mouth daily. Taken with 3mg  with equal 8mg  daily     Allergies:   Patient has no known allergies.   Social History   Socioeconomic History  . Marital status: Married    Spouse name: None  . Number of children: None  . Years of education: None    . Highest education level: None  Social Needs  . Financial resource strain: None  . Food insecurity - worry: None  . Food insecurity - inability: None  . Transportation needs - medical: None  . Transportation needs - non-medical: None  Occupational History  . None  Tobacco Use  . Smoking status: Current Every Day Smoker    Packs/day: 1.00    Years: 58.00    Pack years: 58.00    Types: Cigarettes  . Smokeless tobacco: Former Network engineer and Sexual Activity  . Alcohol use: No  . Drug use: No  . Sexual activity: Yes  Other Topics Concern  . None  Social History Narrative  . None     Family History: The patient's family history includes AAA (abdominal aortic aneurysm) in his father; Atrial fibrillation in his sister; Hypertension in his father; Stroke in his father. ROS:   Please see the history of present illness.    All other systems reviewed and are negative.  EKGs/Labs/Other Studies Reviewed:    The following studies were reviewed today:  EKG:  EKG ordered today.  The ekg ordered today demonstrates SRTh normal  Recent Labs: No results found for requested labs within last 8760 hours.  Recent Lipid Panel No results found for: CHOL, TRIG, HDL, CHOLHDL, VLDL, LDLCALC, LDLDIRECT  Physical Exam:    VS:  BP 108/72   Pulse 68   Ht 5\' 11"  (1.803 m)   Wt 260 lb (117.9 kg)   SpO2 97%   BMI 36.26 kg/m     Wt Readings from Last 3 Encounters:  06/11/17 260 lb (117.9 kg)  05/09/14 253 lb (114.8 kg)  04/29/14 253 lb 8 oz (115 kg)     GEN:  Well nourished, well developed in no acute distress HEENT: Normal NECK: No JVD; No carotid bruits LYMPHATICS: No lymphadenopathy CARDIAC: RRR, no murmurs, rubs, gallops RESPIRATORY:  Clear to auscultation without rales, wheezing or rhonchi  ABDOMEN: Soft, non-tender, non-distended MUSCULOSKELETAL:  No edema; No deformity  SKIN: Warm and dry NEUROLOGIC:  Alert and oriented x 3 PSYCHIATRIC:  Normal affect     Signed, Shirlee More, MD  06/11/2017 10:36 AM    [place]

## 2017-06-11 ENCOUNTER — Ambulatory Visit (INDEPENDENT_AMBULATORY_CARE_PROVIDER_SITE_OTHER): Payer: Medicare Other | Admitting: Cardiology

## 2017-06-11 ENCOUNTER — Encounter: Payer: Self-pay | Admitting: Cardiology

## 2017-06-11 VITALS — BP 108/72 | HR 68 | Ht 71.0 in | Wt 260.0 lb

## 2017-06-11 DIAGNOSIS — Z7901 Long term (current) use of anticoagulants: Secondary | ICD-10-CM | POA: Diagnosis not present

## 2017-06-11 DIAGNOSIS — E782 Mixed hyperlipidemia: Secondary | ICD-10-CM | POA: Diagnosis not present

## 2017-06-11 DIAGNOSIS — I714 Abdominal aortic aneurysm, without rupture, unspecified: Secondary | ICD-10-CM

## 2017-06-11 DIAGNOSIS — I480 Paroxysmal atrial fibrillation: Secondary | ICD-10-CM

## 2017-06-11 NOTE — Patient Instructions (Signed)

## 2017-07-04 DIAGNOSIS — Z7901 Long term (current) use of anticoagulants: Secondary | ICD-10-CM | POA: Diagnosis not present

## 2017-07-04 DIAGNOSIS — I4891 Unspecified atrial fibrillation: Secondary | ICD-10-CM | POA: Diagnosis not present

## 2017-07-04 DIAGNOSIS — Z5181 Encounter for therapeutic drug level monitoring: Secondary | ICD-10-CM | POA: Diagnosis not present

## 2017-07-09 DIAGNOSIS — I714 Abdominal aortic aneurysm, without rupture: Secondary | ICD-10-CM | POA: Diagnosis not present

## 2017-07-09 DIAGNOSIS — I723 Aneurysm of iliac artery: Secondary | ICD-10-CM | POA: Diagnosis not present

## 2017-07-24 DIAGNOSIS — K5010 Crohn's disease of large intestine without complications: Secondary | ICD-10-CM | POA: Diagnosis not present

## 2017-07-28 DIAGNOSIS — J449 Chronic obstructive pulmonary disease, unspecified: Secondary | ICD-10-CM | POA: Diagnosis not present

## 2017-07-28 DIAGNOSIS — Z720 Tobacco use: Secondary | ICD-10-CM | POA: Diagnosis not present

## 2017-07-28 DIAGNOSIS — Z6837 Body mass index (BMI) 37.0-37.9, adult: Secondary | ICD-10-CM | POA: Diagnosis not present

## 2017-08-05 DIAGNOSIS — I714 Abdominal aortic aneurysm, without rupture: Secondary | ICD-10-CM | POA: Diagnosis not present

## 2017-08-05 DIAGNOSIS — I723 Aneurysm of iliac artery: Secondary | ICD-10-CM | POA: Diagnosis not present

## 2017-08-06 DIAGNOSIS — Z139 Encounter for screening, unspecified: Secondary | ICD-10-CM | POA: Diagnosis not present

## 2017-08-06 DIAGNOSIS — Z7901 Long term (current) use of anticoagulants: Secondary | ICD-10-CM | POA: Diagnosis not present

## 2017-08-06 DIAGNOSIS — J449 Chronic obstructive pulmonary disease, unspecified: Secondary | ICD-10-CM | POA: Diagnosis not present

## 2017-08-06 DIAGNOSIS — I4891 Unspecified atrial fibrillation: Secondary | ICD-10-CM | POA: Diagnosis not present

## 2017-08-06 DIAGNOSIS — Z5181 Encounter for therapeutic drug level monitoring: Secondary | ICD-10-CM | POA: Diagnosis not present

## 2017-08-12 DIAGNOSIS — K5010 Crohn's disease of large intestine without complications: Secondary | ICD-10-CM | POA: Diagnosis not present

## 2017-09-05 DIAGNOSIS — Z5181 Encounter for therapeutic drug level monitoring: Secondary | ICD-10-CM | POA: Diagnosis not present

## 2017-09-05 DIAGNOSIS — I4891 Unspecified atrial fibrillation: Secondary | ICD-10-CM | POA: Diagnosis not present

## 2017-09-05 DIAGNOSIS — Z7901 Long term (current) use of anticoagulants: Secondary | ICD-10-CM | POA: Diagnosis not present

## 2017-09-09 DIAGNOSIS — L820 Inflamed seborrheic keratosis: Secondary | ICD-10-CM | POA: Diagnosis not present

## 2017-09-23 DIAGNOSIS — L820 Inflamed seborrheic keratosis: Secondary | ICD-10-CM | POA: Diagnosis not present

## 2017-10-15 DIAGNOSIS — Z6838 Body mass index (BMI) 38.0-38.9, adult: Secondary | ICD-10-CM | POA: Diagnosis not present

## 2017-10-15 DIAGNOSIS — Z5181 Encounter for therapeutic drug level monitoring: Secondary | ICD-10-CM | POA: Diagnosis not present

## 2017-10-15 DIAGNOSIS — I4891 Unspecified atrial fibrillation: Secondary | ICD-10-CM | POA: Diagnosis not present

## 2017-10-15 DIAGNOSIS — Z7901 Long term (current) use of anticoagulants: Secondary | ICD-10-CM | POA: Diagnosis not present

## 2017-10-20 DIAGNOSIS — L820 Inflamed seborrheic keratosis: Secondary | ICD-10-CM | POA: Diagnosis not present

## 2017-11-14 DIAGNOSIS — I4891 Unspecified atrial fibrillation: Secondary | ICD-10-CM | POA: Diagnosis not present

## 2017-11-14 DIAGNOSIS — Z7901 Long term (current) use of anticoagulants: Secondary | ICD-10-CM | POA: Diagnosis not present

## 2017-11-14 DIAGNOSIS — Z5181 Encounter for therapeutic drug level monitoring: Secondary | ICD-10-CM | POA: Diagnosis not present

## 2017-11-28 DIAGNOSIS — Z7901 Long term (current) use of anticoagulants: Secondary | ICD-10-CM | POA: Diagnosis not present

## 2017-11-28 DIAGNOSIS — Z23 Encounter for immunization: Secondary | ICD-10-CM | POA: Diagnosis not present

## 2017-11-28 DIAGNOSIS — I482 Chronic atrial fibrillation: Secondary | ICD-10-CM | POA: Diagnosis not present

## 2017-11-28 DIAGNOSIS — Z5181 Encounter for therapeutic drug level monitoring: Secondary | ICD-10-CM | POA: Diagnosis not present

## 2017-11-28 DIAGNOSIS — H6090 Unspecified otitis externa, unspecified ear: Secondary | ICD-10-CM | POA: Diagnosis not present

## 2017-12-26 DIAGNOSIS — Z7901 Long term (current) use of anticoagulants: Secondary | ICD-10-CM | POA: Diagnosis not present

## 2017-12-26 DIAGNOSIS — I4891 Unspecified atrial fibrillation: Secondary | ICD-10-CM | POA: Diagnosis not present

## 2017-12-26 DIAGNOSIS — Z6839 Body mass index (BMI) 39.0-39.9, adult: Secondary | ICD-10-CM | POA: Diagnosis not present

## 2017-12-26 DIAGNOSIS — Z5181 Encounter for therapeutic drug level monitoring: Secondary | ICD-10-CM | POA: Diagnosis not present

## 2018-01-26 DIAGNOSIS — M7918 Myalgia, other site: Secondary | ICD-10-CM | POA: Diagnosis not present

## 2018-01-26 DIAGNOSIS — I4891 Unspecified atrial fibrillation: Secondary | ICD-10-CM | POA: Diagnosis not present

## 2018-01-26 DIAGNOSIS — Z7901 Long term (current) use of anticoagulants: Secondary | ICD-10-CM | POA: Diagnosis not present

## 2018-01-26 DIAGNOSIS — Z5181 Encounter for therapeutic drug level monitoring: Secondary | ICD-10-CM | POA: Diagnosis not present

## 2018-02-11 DIAGNOSIS — I723 Aneurysm of iliac artery: Secondary | ICD-10-CM | POA: Diagnosis not present

## 2018-02-11 DIAGNOSIS — I714 Abdominal aortic aneurysm, without rupture: Secondary | ICD-10-CM | POA: Diagnosis not present

## 2018-03-04 DIAGNOSIS — I4891 Unspecified atrial fibrillation: Secondary | ICD-10-CM | POA: Diagnosis not present

## 2018-03-04 DIAGNOSIS — Z7901 Long term (current) use of anticoagulants: Secondary | ICD-10-CM | POA: Diagnosis not present

## 2018-03-04 DIAGNOSIS — Z5181 Encounter for therapeutic drug level monitoring: Secondary | ICD-10-CM | POA: Diagnosis not present

## 2018-03-04 DIAGNOSIS — R972 Elevated prostate specific antigen [PSA]: Secondary | ICD-10-CM | POA: Diagnosis not present

## 2018-03-04 DIAGNOSIS — E782 Mixed hyperlipidemia: Secondary | ICD-10-CM | POA: Diagnosis not present

## 2018-03-04 DIAGNOSIS — R7303 Prediabetes: Secondary | ICD-10-CM | POA: Diagnosis not present

## 2018-03-04 DIAGNOSIS — Z139 Encounter for screening, unspecified: Secondary | ICD-10-CM | POA: Diagnosis not present

## 2018-04-06 DIAGNOSIS — E782 Mixed hyperlipidemia: Secondary | ICD-10-CM | POA: Diagnosis not present

## 2018-04-06 DIAGNOSIS — Z7901 Long term (current) use of anticoagulants: Secondary | ICD-10-CM | POA: Diagnosis not present

## 2018-04-06 DIAGNOSIS — I4891 Unspecified atrial fibrillation: Secondary | ICD-10-CM | POA: Diagnosis not present

## 2018-04-06 DIAGNOSIS — Z5181 Encounter for therapeutic drug level monitoring: Secondary | ICD-10-CM | POA: Diagnosis not present

## 2018-04-06 DIAGNOSIS — I482 Chronic atrial fibrillation, unspecified: Secondary | ICD-10-CM | POA: Diagnosis not present

## 2018-04-06 DIAGNOSIS — J449 Chronic obstructive pulmonary disease, unspecified: Secondary | ICD-10-CM | POA: Diagnosis not present

## 2018-04-06 DIAGNOSIS — Z7189 Other specified counseling: Secondary | ICD-10-CM | POA: Diagnosis not present

## 2018-04-06 DIAGNOSIS — R7303 Prediabetes: Secondary | ICD-10-CM | POA: Diagnosis not present

## 2018-05-08 DIAGNOSIS — I4891 Unspecified atrial fibrillation: Secondary | ICD-10-CM | POA: Diagnosis not present

## 2018-05-08 DIAGNOSIS — Z7901 Long term (current) use of anticoagulants: Secondary | ICD-10-CM | POA: Diagnosis not present

## 2018-05-08 DIAGNOSIS — G4700 Insomnia, unspecified: Secondary | ICD-10-CM | POA: Diagnosis not present

## 2018-05-08 DIAGNOSIS — Z5181 Encounter for therapeutic drug level monitoring: Secondary | ICD-10-CM | POA: Diagnosis not present

## 2018-06-05 DIAGNOSIS — N529 Male erectile dysfunction, unspecified: Secondary | ICD-10-CM | POA: Diagnosis not present

## 2018-06-05 DIAGNOSIS — Z5181 Encounter for therapeutic drug level monitoring: Secondary | ICD-10-CM | POA: Diagnosis not present

## 2018-06-05 DIAGNOSIS — Z7901 Long term (current) use of anticoagulants: Secondary | ICD-10-CM | POA: Diagnosis not present

## 2018-06-05 DIAGNOSIS — I4891 Unspecified atrial fibrillation: Secondary | ICD-10-CM | POA: Diagnosis not present

## 2018-07-06 DIAGNOSIS — Z5181 Encounter for therapeutic drug level monitoring: Secondary | ICD-10-CM | POA: Diagnosis not present

## 2018-07-06 DIAGNOSIS — Z7901 Long term (current) use of anticoagulants: Secondary | ICD-10-CM | POA: Diagnosis not present

## 2018-07-06 DIAGNOSIS — I4891 Unspecified atrial fibrillation: Secondary | ICD-10-CM | POA: Diagnosis not present

## 2018-07-14 DIAGNOSIS — S0101XA Laceration without foreign body of scalp, initial encounter: Secondary | ICD-10-CM | POA: Diagnosis not present

## 2018-07-15 DIAGNOSIS — Z1331 Encounter for screening for depression: Secondary | ICD-10-CM | POA: Diagnosis not present

## 2018-07-15 DIAGNOSIS — Z0000 Encounter for general adult medical examination without abnormal findings: Secondary | ICD-10-CM | POA: Diagnosis not present

## 2018-07-15 DIAGNOSIS — Z139 Encounter for screening, unspecified: Secondary | ICD-10-CM | POA: Diagnosis not present

## 2018-08-05 DIAGNOSIS — Z7901 Long term (current) use of anticoagulants: Secondary | ICD-10-CM | POA: Diagnosis not present

## 2018-08-05 DIAGNOSIS — G4733 Obstructive sleep apnea (adult) (pediatric): Secondary | ICD-10-CM | POA: Diagnosis not present

## 2018-08-05 DIAGNOSIS — Z9989 Dependence on other enabling machines and devices: Secondary | ICD-10-CM | POA: Diagnosis not present

## 2018-08-05 DIAGNOSIS — I4891 Unspecified atrial fibrillation: Secondary | ICD-10-CM | POA: Diagnosis not present

## 2018-08-05 DIAGNOSIS — Z5181 Encounter for therapeutic drug level monitoring: Secondary | ICD-10-CM | POA: Diagnosis not present

## 2018-09-07 DIAGNOSIS — Z7901 Long term (current) use of anticoagulants: Secondary | ICD-10-CM | POA: Diagnosis not present

## 2018-09-07 DIAGNOSIS — I4891 Unspecified atrial fibrillation: Secondary | ICD-10-CM | POA: Diagnosis not present

## 2018-09-07 DIAGNOSIS — Z6836 Body mass index (BMI) 36.0-36.9, adult: Secondary | ICD-10-CM | POA: Diagnosis not present

## 2018-09-07 DIAGNOSIS — Z5181 Encounter for therapeutic drug level monitoring: Secondary | ICD-10-CM | POA: Diagnosis not present

## 2018-09-09 DIAGNOSIS — K5010 Crohn's disease of large intestine without complications: Secondary | ICD-10-CM | POA: Diagnosis not present

## 2018-09-17 DIAGNOSIS — K5010 Crohn's disease of large intestine without complications: Secondary | ICD-10-CM | POA: Diagnosis not present

## 2018-10-07 DIAGNOSIS — Z6836 Body mass index (BMI) 36.0-36.9, adult: Secondary | ICD-10-CM | POA: Diagnosis not present

## 2018-10-07 DIAGNOSIS — Z5181 Encounter for therapeutic drug level monitoring: Secondary | ICD-10-CM | POA: Diagnosis not present

## 2018-10-07 DIAGNOSIS — I4891 Unspecified atrial fibrillation: Secondary | ICD-10-CM | POA: Diagnosis not present

## 2018-10-07 DIAGNOSIS — Z7901 Long term (current) use of anticoagulants: Secondary | ICD-10-CM | POA: Diagnosis not present

## 2018-11-09 DIAGNOSIS — I4891 Unspecified atrial fibrillation: Secondary | ICD-10-CM | POA: Diagnosis not present

## 2018-11-09 DIAGNOSIS — Z7901 Long term (current) use of anticoagulants: Secondary | ICD-10-CM | POA: Diagnosis not present

## 2018-11-09 DIAGNOSIS — N529 Male erectile dysfunction, unspecified: Secondary | ICD-10-CM | POA: Diagnosis not present

## 2018-11-09 DIAGNOSIS — Z5181 Encounter for therapeutic drug level monitoring: Secondary | ICD-10-CM | POA: Diagnosis not present

## 2018-11-26 DIAGNOSIS — R197 Diarrhea, unspecified: Secondary | ICD-10-CM | POA: Diagnosis not present

## 2018-12-02 DIAGNOSIS — R197 Diarrhea, unspecified: Secondary | ICD-10-CM | POA: Diagnosis not present

## 2018-12-14 DIAGNOSIS — Z7901 Long term (current) use of anticoagulants: Secondary | ICD-10-CM | POA: Diagnosis not present

## 2018-12-14 DIAGNOSIS — Z5181 Encounter for therapeutic drug level monitoring: Secondary | ICD-10-CM | POA: Diagnosis not present

## 2018-12-14 DIAGNOSIS — I4891 Unspecified atrial fibrillation: Secondary | ICD-10-CM | POA: Diagnosis not present

## 2018-12-24 DIAGNOSIS — K5010 Crohn's disease of large intestine without complications: Secondary | ICD-10-CM | POA: Diagnosis not present

## 2019-01-13 DIAGNOSIS — Z7901 Long term (current) use of anticoagulants: Secondary | ICD-10-CM | POA: Diagnosis not present

## 2019-01-13 DIAGNOSIS — I4891 Unspecified atrial fibrillation: Secondary | ICD-10-CM | POA: Diagnosis not present

## 2019-01-13 DIAGNOSIS — Z23 Encounter for immunization: Secondary | ICD-10-CM | POA: Diagnosis not present

## 2019-01-13 DIAGNOSIS — Z5181 Encounter for therapeutic drug level monitoring: Secondary | ICD-10-CM | POA: Diagnosis not present

## 2019-02-03 DIAGNOSIS — Z7901 Long term (current) use of anticoagulants: Secondary | ICD-10-CM | POA: Diagnosis not present

## 2019-02-03 DIAGNOSIS — K0530 Chronic periodontitis, unspecified: Secondary | ICD-10-CM | POA: Diagnosis not present

## 2019-02-03 DIAGNOSIS — I4891 Unspecified atrial fibrillation: Secondary | ICD-10-CM | POA: Diagnosis not present

## 2019-02-03 DIAGNOSIS — Z5181 Encounter for therapeutic drug level monitoring: Secondary | ICD-10-CM | POA: Diagnosis not present

## 2019-02-08 DIAGNOSIS — Z5181 Encounter for therapeutic drug level monitoring: Secondary | ICD-10-CM | POA: Diagnosis not present

## 2019-02-08 DIAGNOSIS — Z7901 Long term (current) use of anticoagulants: Secondary | ICD-10-CM | POA: Diagnosis not present

## 2019-02-08 DIAGNOSIS — I4891 Unspecified atrial fibrillation: Secondary | ICD-10-CM | POA: Diagnosis not present

## 2019-02-10 DIAGNOSIS — F17210 Nicotine dependence, cigarettes, uncomplicated: Secondary | ICD-10-CM | POA: Diagnosis not present

## 2019-02-10 DIAGNOSIS — I723 Aneurysm of iliac artery: Secondary | ICD-10-CM | POA: Diagnosis not present

## 2019-02-10 DIAGNOSIS — I714 Abdominal aortic aneurysm, without rupture: Secondary | ICD-10-CM | POA: Diagnosis not present

## 2019-04-07 DIAGNOSIS — Z5181 Encounter for therapeutic drug level monitoring: Secondary | ICD-10-CM | POA: Diagnosis not present

## 2019-04-07 DIAGNOSIS — Z7901 Long term (current) use of anticoagulants: Secondary | ICD-10-CM | POA: Diagnosis not present

## 2019-04-07 DIAGNOSIS — I4891 Unspecified atrial fibrillation: Secondary | ICD-10-CM | POA: Diagnosis not present

## 2019-05-07 DIAGNOSIS — Z7901 Long term (current) use of anticoagulants: Secondary | ICD-10-CM | POA: Diagnosis not present

## 2019-05-07 DIAGNOSIS — Z5181 Encounter for therapeutic drug level monitoring: Secondary | ICD-10-CM | POA: Diagnosis not present

## 2019-05-07 DIAGNOSIS — Z6841 Body Mass Index (BMI) 40.0 and over, adult: Secondary | ICD-10-CM | POA: Diagnosis not present

## 2019-05-07 DIAGNOSIS — I4891 Unspecified atrial fibrillation: Secondary | ICD-10-CM | POA: Diagnosis not present

## 2019-05-24 DIAGNOSIS — L820 Inflamed seborrheic keratosis: Secondary | ICD-10-CM | POA: Diagnosis not present

## 2019-05-24 DIAGNOSIS — C44529 Squamous cell carcinoma of skin of other part of trunk: Secondary | ICD-10-CM | POA: Diagnosis not present

## 2019-05-24 DIAGNOSIS — L814 Other melanin hyperpigmentation: Secondary | ICD-10-CM | POA: Diagnosis not present

## 2019-05-24 DIAGNOSIS — D1801 Hemangioma of skin and subcutaneous tissue: Secondary | ICD-10-CM | POA: Diagnosis not present

## 2019-05-24 DIAGNOSIS — D2239 Melanocytic nevi of other parts of face: Secondary | ICD-10-CM | POA: Diagnosis not present

## 2019-05-24 DIAGNOSIS — D225 Melanocytic nevi of trunk: Secondary | ICD-10-CM | POA: Diagnosis not present

## 2019-06-04 DIAGNOSIS — I4891 Unspecified atrial fibrillation: Secondary | ICD-10-CM | POA: Diagnosis not present

## 2019-06-04 DIAGNOSIS — Z7901 Long term (current) use of anticoagulants: Secondary | ICD-10-CM | POA: Diagnosis not present

## 2019-06-04 DIAGNOSIS — Z6841 Body Mass Index (BMI) 40.0 and over, adult: Secondary | ICD-10-CM | POA: Diagnosis not present

## 2019-06-04 DIAGNOSIS — Z5181 Encounter for therapeutic drug level monitoring: Secondary | ICD-10-CM | POA: Diagnosis not present

## 2019-07-05 DIAGNOSIS — I4891 Unspecified atrial fibrillation: Secondary | ICD-10-CM | POA: Diagnosis not present

## 2019-07-05 DIAGNOSIS — Z5181 Encounter for therapeutic drug level monitoring: Secondary | ICD-10-CM | POA: Diagnosis not present

## 2019-07-05 DIAGNOSIS — Z7901 Long term (current) use of anticoagulants: Secondary | ICD-10-CM | POA: Diagnosis not present

## 2019-07-05 DIAGNOSIS — Z1331 Encounter for screening for depression: Secondary | ICD-10-CM | POA: Diagnosis not present

## 2019-07-06 DIAGNOSIS — Z789 Other specified health status: Secondary | ICD-10-CM | POA: Diagnosis not present

## 2019-07-06 DIAGNOSIS — L299 Pruritus, unspecified: Secondary | ICD-10-CM | POA: Diagnosis not present

## 2019-07-26 DIAGNOSIS — Z7901 Long term (current) use of anticoagulants: Secondary | ICD-10-CM | POA: Diagnosis not present

## 2019-07-26 DIAGNOSIS — Z7189 Other specified counseling: Secondary | ICD-10-CM | POA: Diagnosis not present

## 2019-07-26 DIAGNOSIS — Z1331 Encounter for screening for depression: Secondary | ICD-10-CM | POA: Diagnosis not present

## 2019-07-26 DIAGNOSIS — Z5181 Encounter for therapeutic drug level monitoring: Secondary | ICD-10-CM | POA: Diagnosis not present

## 2019-07-26 DIAGNOSIS — Z0000 Encounter for general adult medical examination without abnormal findings: Secondary | ICD-10-CM | POA: Diagnosis not present

## 2019-07-26 DIAGNOSIS — Z1339 Encounter for screening examination for other mental health and behavioral disorders: Secondary | ICD-10-CM | POA: Diagnosis not present

## 2019-07-26 DIAGNOSIS — Z139 Encounter for screening, unspecified: Secondary | ICD-10-CM | POA: Diagnosis not present

## 2019-07-26 DIAGNOSIS — I4891 Unspecified atrial fibrillation: Secondary | ICD-10-CM | POA: Diagnosis not present

## 2019-07-26 DIAGNOSIS — Z136 Encounter for screening for cardiovascular disorders: Secondary | ICD-10-CM | POA: Diagnosis not present

## 2019-07-26 DIAGNOSIS — Z6837 Body mass index (BMI) 37.0-37.9, adult: Secondary | ICD-10-CM | POA: Diagnosis not present

## 2019-08-20 DIAGNOSIS — F17210 Nicotine dependence, cigarettes, uncomplicated: Secondary | ICD-10-CM | POA: Diagnosis not present

## 2019-08-20 DIAGNOSIS — I714 Abdominal aortic aneurysm, without rupture: Secondary | ICD-10-CM | POA: Diagnosis not present

## 2019-08-20 DIAGNOSIS — E782 Mixed hyperlipidemia: Secondary | ICD-10-CM | POA: Diagnosis not present

## 2019-08-20 DIAGNOSIS — I723 Aneurysm of iliac artery: Secondary | ICD-10-CM | POA: Diagnosis not present

## 2019-09-06 DIAGNOSIS — Z6837 Body mass index (BMI) 37.0-37.9, adult: Secondary | ICD-10-CM | POA: Diagnosis not present

## 2019-09-06 DIAGNOSIS — I4891 Unspecified atrial fibrillation: Secondary | ICD-10-CM | POA: Diagnosis not present

## 2019-09-06 DIAGNOSIS — Z5181 Encounter for therapeutic drug level monitoring: Secondary | ICD-10-CM | POA: Diagnosis not present

## 2019-09-06 DIAGNOSIS — Z7901 Long term (current) use of anticoagulants: Secondary | ICD-10-CM | POA: Diagnosis not present

## 2019-10-05 DIAGNOSIS — K5010 Crohn's disease of large intestine without complications: Secondary | ICD-10-CM | POA: Diagnosis not present

## 2019-11-05 DIAGNOSIS — Z6838 Body mass index (BMI) 38.0-38.9, adult: Secondary | ICD-10-CM | POA: Diagnosis not present

## 2019-11-05 DIAGNOSIS — I4891 Unspecified atrial fibrillation: Secondary | ICD-10-CM | POA: Diagnosis not present

## 2019-11-05 DIAGNOSIS — Z7901 Long term (current) use of anticoagulants: Secondary | ICD-10-CM | POA: Diagnosis not present

## 2019-12-01 DIAGNOSIS — E782 Mixed hyperlipidemia: Secondary | ICD-10-CM | POA: Diagnosis not present

## 2019-12-01 DIAGNOSIS — R7303 Prediabetes: Secondary | ICD-10-CM | POA: Diagnosis not present

## 2019-12-01 DIAGNOSIS — Z125 Encounter for screening for malignant neoplasm of prostate: Secondary | ICD-10-CM | POA: Diagnosis not present

## 2019-12-08 DIAGNOSIS — J449 Chronic obstructive pulmonary disease, unspecified: Secondary | ICD-10-CM | POA: Diagnosis not present

## 2019-12-08 DIAGNOSIS — I482 Chronic atrial fibrillation, unspecified: Secondary | ICD-10-CM | POA: Diagnosis not present

## 2019-12-08 DIAGNOSIS — R7303 Prediabetes: Secondary | ICD-10-CM | POA: Diagnosis not present

## 2019-12-08 DIAGNOSIS — E782 Mixed hyperlipidemia: Secondary | ICD-10-CM | POA: Diagnosis not present

## 2019-12-21 DIAGNOSIS — L918 Other hypertrophic disorders of the skin: Secondary | ICD-10-CM | POA: Diagnosis not present

## 2019-12-21 DIAGNOSIS — L739 Follicular disorder, unspecified: Secondary | ICD-10-CM | POA: Diagnosis not present

## 2019-12-21 DIAGNOSIS — L820 Inflamed seborrheic keratosis: Secondary | ICD-10-CM | POA: Diagnosis not present

## 2020-01-10 DIAGNOSIS — I482 Chronic atrial fibrillation, unspecified: Secondary | ICD-10-CM | POA: Diagnosis not present

## 2020-01-10 DIAGNOSIS — Z23 Encounter for immunization: Secondary | ICD-10-CM | POA: Diagnosis not present

## 2020-01-10 DIAGNOSIS — Z7901 Long term (current) use of anticoagulants: Secondary | ICD-10-CM | POA: Diagnosis not present

## 2020-01-10 DIAGNOSIS — Z5181 Encounter for therapeutic drug level monitoring: Secondary | ICD-10-CM | POA: Diagnosis not present

## 2020-01-18 DIAGNOSIS — Z23 Encounter for immunization: Secondary | ICD-10-CM | POA: Diagnosis not present

## 2020-02-14 DIAGNOSIS — Z5181 Encounter for therapeutic drug level monitoring: Secondary | ICD-10-CM | POA: Diagnosis not present

## 2020-02-14 DIAGNOSIS — Z6838 Body mass index (BMI) 38.0-38.9, adult: Secondary | ICD-10-CM | POA: Diagnosis not present

## 2020-02-14 DIAGNOSIS — Z7901 Long term (current) use of anticoagulants: Secondary | ICD-10-CM | POA: Diagnosis not present

## 2020-02-14 DIAGNOSIS — I4891 Unspecified atrial fibrillation: Secondary | ICD-10-CM | POA: Diagnosis not present

## 2020-03-10 DIAGNOSIS — Z6839 Body mass index (BMI) 39.0-39.9, adult: Secondary | ICD-10-CM | POA: Diagnosis not present

## 2020-03-10 DIAGNOSIS — Z139 Encounter for screening, unspecified: Secondary | ICD-10-CM | POA: Diagnosis not present

## 2020-03-10 DIAGNOSIS — R1031 Right lower quadrant pain: Secondary | ICD-10-CM | POA: Diagnosis not present

## 2020-03-14 DIAGNOSIS — M79642 Pain in left hand: Secondary | ICD-10-CM | POA: Diagnosis not present

## 2020-03-14 DIAGNOSIS — I4891 Unspecified atrial fibrillation: Secondary | ICD-10-CM | POA: Diagnosis not present

## 2020-05-24 DIAGNOSIS — R7303 Prediabetes: Secondary | ICD-10-CM | POA: Diagnosis not present

## 2020-05-24 DIAGNOSIS — E782 Mixed hyperlipidemia: Secondary | ICD-10-CM | POA: Diagnosis not present

## 2020-05-30 DIAGNOSIS — G4733 Obstructive sleep apnea (adult) (pediatric): Secondary | ICD-10-CM | POA: Diagnosis not present

## 2020-05-31 DIAGNOSIS — J449 Chronic obstructive pulmonary disease, unspecified: Secondary | ICD-10-CM | POA: Diagnosis not present

## 2020-05-31 DIAGNOSIS — E782 Mixed hyperlipidemia: Secondary | ICD-10-CM | POA: Diagnosis not present

## 2020-05-31 DIAGNOSIS — I482 Chronic atrial fibrillation, unspecified: Secondary | ICD-10-CM | POA: Diagnosis not present

## 2020-05-31 DIAGNOSIS — R7303 Prediabetes: Secondary | ICD-10-CM | POA: Diagnosis not present

## 2020-06-27 DIAGNOSIS — D225 Melanocytic nevi of trunk: Secondary | ICD-10-CM | POA: Diagnosis not present

## 2020-06-27 DIAGNOSIS — L820 Inflamed seborrheic keratosis: Secondary | ICD-10-CM | POA: Diagnosis not present

## 2020-06-27 DIAGNOSIS — D1801 Hemangioma of skin and subcutaneous tissue: Secondary | ICD-10-CM | POA: Diagnosis not present

## 2020-06-28 DIAGNOSIS — F17200 Nicotine dependence, unspecified, uncomplicated: Secondary | ICD-10-CM | POA: Diagnosis not present

## 2020-06-28 DIAGNOSIS — Z7901 Long term (current) use of anticoagulants: Secondary | ICD-10-CM | POA: Diagnosis not present

## 2020-06-28 DIAGNOSIS — Z5181 Encounter for therapeutic drug level monitoring: Secondary | ICD-10-CM | POA: Diagnosis not present

## 2020-06-28 DIAGNOSIS — I482 Chronic atrial fibrillation, unspecified: Secondary | ICD-10-CM | POA: Diagnosis not present

## 2020-07-24 DIAGNOSIS — I482 Chronic atrial fibrillation, unspecified: Secondary | ICD-10-CM | POA: Diagnosis not present

## 2020-07-24 DIAGNOSIS — Z7901 Long term (current) use of anticoagulants: Secondary | ICD-10-CM | POA: Diagnosis not present

## 2020-07-24 DIAGNOSIS — Z5181 Encounter for therapeutic drug level monitoring: Secondary | ICD-10-CM | POA: Diagnosis not present

## 2020-08-28 DIAGNOSIS — Z7189 Other specified counseling: Secondary | ICD-10-CM | POA: Diagnosis not present

## 2020-08-28 DIAGNOSIS — Z6841 Body Mass Index (BMI) 40.0 and over, adult: Secondary | ICD-10-CM | POA: Diagnosis not present

## 2020-08-28 DIAGNOSIS — Z5181 Encounter for therapeutic drug level monitoring: Secondary | ICD-10-CM | POA: Diagnosis not present

## 2020-08-28 DIAGNOSIS — Z0000 Encounter for general adult medical examination without abnormal findings: Secondary | ICD-10-CM | POA: Diagnosis not present

## 2020-08-28 DIAGNOSIS — E669 Obesity, unspecified: Secondary | ICD-10-CM | POA: Diagnosis not present

## 2020-08-28 DIAGNOSIS — G4733 Obstructive sleep apnea (adult) (pediatric): Secondary | ICD-10-CM | POA: Diagnosis not present

## 2020-08-28 DIAGNOSIS — Z139 Encounter for screening, unspecified: Secondary | ICD-10-CM | POA: Diagnosis not present

## 2020-08-28 DIAGNOSIS — Z7901 Long term (current) use of anticoagulants: Secondary | ICD-10-CM | POA: Diagnosis not present

## 2020-08-28 DIAGNOSIS — Z1331 Encounter for screening for depression: Secondary | ICD-10-CM | POA: Diagnosis not present

## 2020-08-28 DIAGNOSIS — I482 Chronic atrial fibrillation, unspecified: Secondary | ICD-10-CM | POA: Diagnosis not present

## 2020-09-06 DIAGNOSIS — K5010 Crohn's disease of large intestine without complications: Secondary | ICD-10-CM | POA: Diagnosis not present

## 2020-09-21 DIAGNOSIS — K5010 Crohn's disease of large intestine without complications: Secondary | ICD-10-CM | POA: Diagnosis not present

## 2020-09-21 DIAGNOSIS — Z86010 Personal history of colonic polyps: Secondary | ICD-10-CM | POA: Diagnosis not present

## 2020-09-29 DIAGNOSIS — I482 Chronic atrial fibrillation, unspecified: Secondary | ICD-10-CM | POA: Diagnosis not present

## 2020-10-20 DIAGNOSIS — Z139 Encounter for screening, unspecified: Secondary | ICD-10-CM | POA: Diagnosis not present

## 2020-10-20 DIAGNOSIS — Z5181 Encounter for therapeutic drug level monitoring: Secondary | ICD-10-CM | POA: Diagnosis not present

## 2020-10-20 DIAGNOSIS — Z7901 Long term (current) use of anticoagulants: Secondary | ICD-10-CM | POA: Diagnosis not present

## 2020-10-20 DIAGNOSIS — I482 Chronic atrial fibrillation, unspecified: Secondary | ICD-10-CM | POA: Diagnosis not present

## 2020-10-31 DIAGNOSIS — K5010 Crohn's disease of large intestine without complications: Secondary | ICD-10-CM | POA: Diagnosis not present

## 2020-11-01 DIAGNOSIS — K635 Polyp of colon: Secondary | ICD-10-CM | POA: Diagnosis not present

## 2020-11-01 DIAGNOSIS — K5010 Crohn's disease of large intestine without complications: Secondary | ICD-10-CM | POA: Diagnosis not present

## 2020-11-01 DIAGNOSIS — Z86010 Personal history of colonic polyps: Secondary | ICD-10-CM | POA: Diagnosis not present

## 2020-11-01 DIAGNOSIS — D125 Benign neoplasm of sigmoid colon: Secondary | ICD-10-CM | POA: Diagnosis not present

## 2020-11-06 DIAGNOSIS — E782 Mixed hyperlipidemia: Secondary | ICD-10-CM | POA: Diagnosis not present

## 2020-11-06 DIAGNOSIS — R7303 Prediabetes: Secondary | ICD-10-CM | POA: Diagnosis not present

## 2020-11-08 DIAGNOSIS — S61412A Laceration without foreign body of left hand, initial encounter: Secondary | ICD-10-CM | POA: Diagnosis not present

## 2020-11-24 DIAGNOSIS — I482 Chronic atrial fibrillation, unspecified: Secondary | ICD-10-CM | POA: Diagnosis not present

## 2020-11-24 DIAGNOSIS — Z7901 Long term (current) use of anticoagulants: Secondary | ICD-10-CM | POA: Diagnosis not present

## 2020-11-24 DIAGNOSIS — R7303 Prediabetes: Secondary | ICD-10-CM | POA: Diagnosis not present

## 2020-11-24 DIAGNOSIS — Z125 Encounter for screening for malignant neoplasm of prostate: Secondary | ICD-10-CM | POA: Diagnosis not present

## 2020-11-24 DIAGNOSIS — E782 Mixed hyperlipidemia: Secondary | ICD-10-CM | POA: Diagnosis not present

## 2020-11-30 DIAGNOSIS — G4733 Obstructive sleep apnea (adult) (pediatric): Secondary | ICD-10-CM | POA: Diagnosis not present

## 2020-12-29 DIAGNOSIS — I482 Chronic atrial fibrillation, unspecified: Secondary | ICD-10-CM | POA: Diagnosis not present

## 2020-12-29 DIAGNOSIS — Z5181 Encounter for therapeutic drug level monitoring: Secondary | ICD-10-CM | POA: Diagnosis not present

## 2020-12-29 DIAGNOSIS — Z7901 Long term (current) use of anticoagulants: Secondary | ICD-10-CM | POA: Diagnosis not present

## 2020-12-29 DIAGNOSIS — Z23 Encounter for immunization: Secondary | ICD-10-CM | POA: Diagnosis not present

## 2020-12-29 DIAGNOSIS — R972 Elevated prostate specific antigen [PSA]: Secondary | ICD-10-CM | POA: Diagnosis not present

## 2021-01-02 DIAGNOSIS — L820 Inflamed seborrheic keratosis: Secondary | ICD-10-CM | POA: Diagnosis not present

## 2021-01-29 DIAGNOSIS — I482 Chronic atrial fibrillation, unspecified: Secondary | ICD-10-CM | POA: Diagnosis not present

## 2021-01-29 DIAGNOSIS — Z6838 Body mass index (BMI) 38.0-38.9, adult: Secondary | ICD-10-CM | POA: Diagnosis not present

## 2021-02-27 DIAGNOSIS — L600 Ingrowing nail: Secondary | ICD-10-CM | POA: Diagnosis not present

## 2021-02-27 DIAGNOSIS — M67472 Ganglion, left ankle and foot: Secondary | ICD-10-CM | POA: Diagnosis not present

## 2021-03-05 DIAGNOSIS — Z6838 Body mass index (BMI) 38.0-38.9, adult: Secondary | ICD-10-CM | POA: Diagnosis not present

## 2021-03-05 DIAGNOSIS — I482 Chronic atrial fibrillation, unspecified: Secondary | ICD-10-CM | POA: Diagnosis not present

## 2021-03-05 DIAGNOSIS — Z7901 Long term (current) use of anticoagulants: Secondary | ICD-10-CM | POA: Diagnosis not present

## 2021-03-05 DIAGNOSIS — Z5181 Encounter for therapeutic drug level monitoring: Secondary | ICD-10-CM | POA: Diagnosis not present

## 2021-04-03 DIAGNOSIS — I723 Aneurysm of iliac artery: Secondary | ICD-10-CM | POA: Diagnosis not present

## 2021-04-03 DIAGNOSIS — I7789 Other specified disorders of arteries and arterioles: Secondary | ICD-10-CM | POA: Diagnosis not present

## 2021-04-03 DIAGNOSIS — L820 Inflamed seborrheic keratosis: Secondary | ICD-10-CM | POA: Diagnosis not present

## 2021-04-03 DIAGNOSIS — I7143 Infrarenal abdominal aortic aneurysm, without rupture: Secondary | ICD-10-CM | POA: Diagnosis not present

## 2021-04-03 DIAGNOSIS — E782 Mixed hyperlipidemia: Secondary | ICD-10-CM | POA: Diagnosis not present

## 2021-04-03 DIAGNOSIS — F17210 Nicotine dependence, cigarettes, uncomplicated: Secondary | ICD-10-CM | POA: Diagnosis not present

## 2021-04-09 DIAGNOSIS — Z5181 Encounter for therapeutic drug level monitoring: Secondary | ICD-10-CM | POA: Diagnosis not present

## 2021-04-09 DIAGNOSIS — Z7901 Long term (current) use of anticoagulants: Secondary | ICD-10-CM | POA: Diagnosis not present

## 2021-04-09 DIAGNOSIS — I482 Chronic atrial fibrillation, unspecified: Secondary | ICD-10-CM | POA: Diagnosis not present

## 2021-04-09 DIAGNOSIS — Z1331 Encounter for screening for depression: Secondary | ICD-10-CM | POA: Diagnosis not present

## 2021-04-09 DIAGNOSIS — Z139 Encounter for screening, unspecified: Secondary | ICD-10-CM | POA: Diagnosis not present

## 2021-05-08 DIAGNOSIS — M21062 Valgus deformity, not elsewhere classified, left knee: Secondary | ICD-10-CM | POA: Diagnosis not present

## 2021-05-08 DIAGNOSIS — M1712 Unilateral primary osteoarthritis, left knee: Secondary | ICD-10-CM | POA: Diagnosis not present

## 2021-05-08 DIAGNOSIS — Z96651 Presence of right artificial knee joint: Secondary | ICD-10-CM | POA: Diagnosis not present

## 2021-05-08 DIAGNOSIS — M25562 Pain in left knee: Secondary | ICD-10-CM | POA: Diagnosis not present

## 2021-05-11 DIAGNOSIS — Z7901 Long term (current) use of anticoagulants: Secondary | ICD-10-CM | POA: Diagnosis not present

## 2021-05-11 DIAGNOSIS — I482 Chronic atrial fibrillation, unspecified: Secondary | ICD-10-CM | POA: Diagnosis not present

## 2021-05-11 DIAGNOSIS — Z6839 Body mass index (BMI) 39.0-39.9, adult: Secondary | ICD-10-CM | POA: Diagnosis not present

## 2021-05-11 DIAGNOSIS — Z5181 Encounter for therapeutic drug level monitoring: Secondary | ICD-10-CM | POA: Diagnosis not present

## 2021-06-11 DIAGNOSIS — R7303 Prediabetes: Secondary | ICD-10-CM | POA: Diagnosis not present

## 2021-06-11 DIAGNOSIS — E782 Mixed hyperlipidemia: Secondary | ICD-10-CM | POA: Diagnosis not present

## 2021-06-20 DIAGNOSIS — I482 Chronic atrial fibrillation, unspecified: Secondary | ICD-10-CM | POA: Diagnosis not present

## 2021-06-20 DIAGNOSIS — J449 Chronic obstructive pulmonary disease, unspecified: Secondary | ICD-10-CM | POA: Diagnosis not present

## 2021-06-20 DIAGNOSIS — R7303 Prediabetes: Secondary | ICD-10-CM | POA: Diagnosis not present

## 2021-06-20 DIAGNOSIS — E782 Mixed hyperlipidemia: Secondary | ICD-10-CM | POA: Diagnosis not present

## 2021-07-20 DIAGNOSIS — Z5181 Encounter for therapeutic drug level monitoring: Secondary | ICD-10-CM | POA: Diagnosis not present

## 2021-07-20 DIAGNOSIS — I482 Chronic atrial fibrillation, unspecified: Secondary | ICD-10-CM | POA: Diagnosis not present

## 2021-07-20 DIAGNOSIS — Z6838 Body mass index (BMI) 38.0-38.9, adult: Secondary | ICD-10-CM | POA: Diagnosis not present

## 2021-07-20 DIAGNOSIS — Z7901 Long term (current) use of anticoagulants: Secondary | ICD-10-CM | POA: Diagnosis not present

## 2021-08-17 DIAGNOSIS — I482 Chronic atrial fibrillation, unspecified: Secondary | ICD-10-CM | POA: Diagnosis not present

## 2021-08-17 DIAGNOSIS — Z5181 Encounter for therapeutic drug level monitoring: Secondary | ICD-10-CM | POA: Diagnosis not present

## 2021-08-17 DIAGNOSIS — Z6838 Body mass index (BMI) 38.0-38.9, adult: Secondary | ICD-10-CM | POA: Diagnosis not present

## 2021-08-17 DIAGNOSIS — Z7901 Long term (current) use of anticoagulants: Secondary | ICD-10-CM | POA: Diagnosis not present

## 2021-09-14 DIAGNOSIS — Z7901 Long term (current) use of anticoagulants: Secondary | ICD-10-CM | POA: Diagnosis not present

## 2021-09-14 DIAGNOSIS — I482 Chronic atrial fibrillation, unspecified: Secondary | ICD-10-CM | POA: Diagnosis not present

## 2021-09-14 DIAGNOSIS — Z6838 Body mass index (BMI) 38.0-38.9, adult: Secondary | ICD-10-CM | POA: Diagnosis not present

## 2021-09-17 DIAGNOSIS — K5010 Crohn's disease of large intestine without complications: Secondary | ICD-10-CM | POA: Diagnosis not present

## 2021-09-19 DIAGNOSIS — I482 Chronic atrial fibrillation, unspecified: Secondary | ICD-10-CM | POA: Diagnosis not present

## 2021-09-19 DIAGNOSIS — Z7901 Long term (current) use of anticoagulants: Secondary | ICD-10-CM | POA: Diagnosis not present

## 2021-09-19 DIAGNOSIS — Z5181 Encounter for therapeutic drug level monitoring: Secondary | ICD-10-CM | POA: Diagnosis not present

## 2021-09-19 DIAGNOSIS — Z6838 Body mass index (BMI) 38.0-38.9, adult: Secondary | ICD-10-CM | POA: Diagnosis not present

## 2021-09-20 DIAGNOSIS — K5010 Crohn's disease of large intestine without complications: Secondary | ICD-10-CM | POA: Diagnosis not present

## 2021-10-16 DIAGNOSIS — L820 Inflamed seborrheic keratosis: Secondary | ICD-10-CM | POA: Diagnosis not present

## 2021-10-22 DIAGNOSIS — Z5181 Encounter for therapeutic drug level monitoring: Secondary | ICD-10-CM | POA: Diagnosis not present

## 2021-10-22 DIAGNOSIS — Z0000 Encounter for general adult medical examination without abnormal findings: Secondary | ICD-10-CM | POA: Diagnosis not present

## 2021-10-22 DIAGNOSIS — I482 Chronic atrial fibrillation, unspecified: Secondary | ICD-10-CM | POA: Diagnosis not present

## 2021-10-22 DIAGNOSIS — R140 Abdominal distension (gaseous): Secondary | ICD-10-CM | POA: Diagnosis not present

## 2021-10-22 DIAGNOSIS — Z1331 Encounter for screening for depression: Secondary | ICD-10-CM | POA: Diagnosis not present

## 2021-10-22 DIAGNOSIS — Z1339 Encounter for screening examination for other mental health and behavioral disorders: Secondary | ICD-10-CM | POA: Diagnosis not present

## 2021-10-22 DIAGNOSIS — R109 Unspecified abdominal pain: Secondary | ICD-10-CM | POA: Diagnosis not present

## 2021-10-22 DIAGNOSIS — Z139 Encounter for screening, unspecified: Secondary | ICD-10-CM | POA: Diagnosis not present

## 2021-10-22 DIAGNOSIS — Z1389 Encounter for screening for other disorder: Secondary | ICD-10-CM | POA: Diagnosis not present

## 2021-11-05 DIAGNOSIS — I482 Chronic atrial fibrillation, unspecified: Secondary | ICD-10-CM | POA: Diagnosis not present

## 2021-11-05 DIAGNOSIS — Z6838 Body mass index (BMI) 38.0-38.9, adult: Secondary | ICD-10-CM | POA: Diagnosis not present

## 2021-11-05 DIAGNOSIS — Z5181 Encounter for therapeutic drug level monitoring: Secondary | ICD-10-CM | POA: Diagnosis not present

## 2021-11-05 DIAGNOSIS — Z7901 Long term (current) use of anticoagulants: Secondary | ICD-10-CM | POA: Diagnosis not present

## 2021-11-29 DIAGNOSIS — K5010 Crohn's disease of large intestine without complications: Secondary | ICD-10-CM | POA: Diagnosis not present

## 2021-12-04 DIAGNOSIS — E782 Mixed hyperlipidemia: Secondary | ICD-10-CM | POA: Diagnosis not present

## 2021-12-04 DIAGNOSIS — R7303 Prediabetes: Secondary | ICD-10-CM | POA: Diagnosis not present

## 2021-12-10 DIAGNOSIS — R7303 Prediabetes: Secondary | ICD-10-CM | POA: Diagnosis not present

## 2021-12-10 DIAGNOSIS — Z6838 Body mass index (BMI) 38.0-38.9, adult: Secondary | ICD-10-CM | POA: Diagnosis not present

## 2021-12-10 DIAGNOSIS — E782 Mixed hyperlipidemia: Secondary | ICD-10-CM | POA: Diagnosis not present

## 2021-12-10 DIAGNOSIS — I482 Chronic atrial fibrillation, unspecified: Secondary | ICD-10-CM | POA: Diagnosis not present

## 2021-12-10 DIAGNOSIS — Z139 Encounter for screening, unspecified: Secondary | ICD-10-CM | POA: Diagnosis not present

## 2021-12-10 DIAGNOSIS — J449 Chronic obstructive pulmonary disease, unspecified: Secondary | ICD-10-CM | POA: Diagnosis not present

## 2021-12-13 DIAGNOSIS — K5010 Crohn's disease of large intestine without complications: Secondary | ICD-10-CM | POA: Diagnosis not present

## 2022-01-11 DIAGNOSIS — Z5181 Encounter for therapeutic drug level monitoring: Secondary | ICD-10-CM | POA: Diagnosis not present

## 2022-01-11 DIAGNOSIS — Z23 Encounter for immunization: Secondary | ICD-10-CM | POA: Diagnosis not present

## 2022-01-11 DIAGNOSIS — I482 Chronic atrial fibrillation, unspecified: Secondary | ICD-10-CM | POA: Diagnosis not present

## 2022-01-11 DIAGNOSIS — Z6839 Body mass index (BMI) 39.0-39.9, adult: Secondary | ICD-10-CM | POA: Diagnosis not present

## 2022-01-11 DIAGNOSIS — Z7901 Long term (current) use of anticoagulants: Secondary | ICD-10-CM | POA: Diagnosis not present

## 2022-01-22 DIAGNOSIS — R7303 Prediabetes: Secondary | ICD-10-CM | POA: Diagnosis not present

## 2022-01-22 DIAGNOSIS — Z6839 Body mass index (BMI) 39.0-39.9, adult: Secondary | ICD-10-CM | POA: Diagnosis not present

## 2022-01-22 DIAGNOSIS — E782 Mixed hyperlipidemia: Secondary | ICD-10-CM | POA: Diagnosis not present

## 2022-01-29 DIAGNOSIS — Z6838 Body mass index (BMI) 38.0-38.9, adult: Secondary | ICD-10-CM | POA: Diagnosis not present

## 2022-01-29 DIAGNOSIS — Z713 Dietary counseling and surveillance: Secondary | ICD-10-CM | POA: Diagnosis not present

## 2022-01-29 DIAGNOSIS — E782 Mixed hyperlipidemia: Secondary | ICD-10-CM | POA: Diagnosis not present

## 2022-02-05 DIAGNOSIS — J449 Chronic obstructive pulmonary disease, unspecified: Secondary | ICD-10-CM | POA: Diagnosis not present

## 2022-02-05 DIAGNOSIS — Z713 Dietary counseling and surveillance: Secondary | ICD-10-CM | POA: Diagnosis not present

## 2022-02-05 DIAGNOSIS — Z6838 Body mass index (BMI) 38.0-38.9, adult: Secondary | ICD-10-CM | POA: Diagnosis not present

## 2022-02-11 DIAGNOSIS — I482 Chronic atrial fibrillation, unspecified: Secondary | ICD-10-CM | POA: Diagnosis not present

## 2022-02-11 DIAGNOSIS — Z5181 Encounter for therapeutic drug level monitoring: Secondary | ICD-10-CM | POA: Diagnosis not present

## 2022-02-11 DIAGNOSIS — Z23 Encounter for immunization: Secondary | ICD-10-CM | POA: Diagnosis not present

## 2022-02-11 DIAGNOSIS — F17200 Nicotine dependence, unspecified, uncomplicated: Secondary | ICD-10-CM | POA: Diagnosis not present

## 2022-02-11 DIAGNOSIS — Z6837 Body mass index (BMI) 37.0-37.9, adult: Secondary | ICD-10-CM | POA: Diagnosis not present

## 2022-02-11 DIAGNOSIS — Z139 Encounter for screening, unspecified: Secondary | ICD-10-CM | POA: Diagnosis not present

## 2022-02-12 DIAGNOSIS — R7303 Prediabetes: Secondary | ICD-10-CM | POA: Diagnosis not present

## 2022-02-12 DIAGNOSIS — Z713 Dietary counseling and surveillance: Secondary | ICD-10-CM | POA: Diagnosis not present

## 2022-02-12 DIAGNOSIS — Z6838 Body mass index (BMI) 38.0-38.9, adult: Secondary | ICD-10-CM | POA: Diagnosis not present

## 2022-02-21 DIAGNOSIS — M1712 Unilateral primary osteoarthritis, left knee: Secondary | ICD-10-CM | POA: Diagnosis not present

## 2022-02-26 DIAGNOSIS — Z713 Dietary counseling and surveillance: Secondary | ICD-10-CM | POA: Diagnosis not present

## 2022-02-26 DIAGNOSIS — Z6837 Body mass index (BMI) 37.0-37.9, adult: Secondary | ICD-10-CM | POA: Diagnosis not present

## 2022-02-26 DIAGNOSIS — R7303 Prediabetes: Secondary | ICD-10-CM | POA: Diagnosis not present

## 2022-02-26 DIAGNOSIS — E782 Mixed hyperlipidemia: Secondary | ICD-10-CM | POA: Diagnosis not present

## 2022-02-26 DIAGNOSIS — Z139 Encounter for screening, unspecified: Secondary | ICD-10-CM | POA: Diagnosis not present

## 2022-03-05 DIAGNOSIS — R7303 Prediabetes: Secondary | ICD-10-CM | POA: Diagnosis not present

## 2022-03-05 DIAGNOSIS — Z6837 Body mass index (BMI) 37.0-37.9, adult: Secondary | ICD-10-CM | POA: Diagnosis not present

## 2022-03-05 DIAGNOSIS — E782 Mixed hyperlipidemia: Secondary | ICD-10-CM | POA: Diagnosis not present

## 2022-03-05 DIAGNOSIS — Z713 Dietary counseling and surveillance: Secondary | ICD-10-CM | POA: Diagnosis not present

## 2022-03-12 DIAGNOSIS — Z6837 Body mass index (BMI) 37.0-37.9, adult: Secondary | ICD-10-CM | POA: Diagnosis not present

## 2022-03-12 DIAGNOSIS — Z713 Dietary counseling and surveillance: Secondary | ICD-10-CM | POA: Diagnosis not present

## 2022-03-12 DIAGNOSIS — E782 Mixed hyperlipidemia: Secondary | ICD-10-CM | POA: Diagnosis not present

## 2022-03-12 DIAGNOSIS — Z139 Encounter for screening, unspecified: Secondary | ICD-10-CM | POA: Diagnosis not present

## 2022-03-15 DIAGNOSIS — Z6837 Body mass index (BMI) 37.0-37.9, adult: Secondary | ICD-10-CM | POA: Diagnosis not present

## 2022-03-15 DIAGNOSIS — Z7901 Long term (current) use of anticoagulants: Secondary | ICD-10-CM | POA: Diagnosis not present

## 2022-03-15 DIAGNOSIS — I482 Chronic atrial fibrillation, unspecified: Secondary | ICD-10-CM | POA: Diagnosis not present

## 2022-03-15 DIAGNOSIS — Z5181 Encounter for therapeutic drug level monitoring: Secondary | ICD-10-CM | POA: Diagnosis not present

## 2022-04-04 DIAGNOSIS — I723 Aneurysm of iliac artery: Secondary | ICD-10-CM | POA: Diagnosis not present

## 2022-04-04 DIAGNOSIS — F17210 Nicotine dependence, cigarettes, uncomplicated: Secondary | ICD-10-CM | POA: Diagnosis not present

## 2022-04-04 DIAGNOSIS — E785 Hyperlipidemia, unspecified: Secondary | ICD-10-CM | POA: Diagnosis not present

## 2022-04-04 DIAGNOSIS — I7143 Infrarenal abdominal aortic aneurysm, without rupture: Secondary | ICD-10-CM | POA: Diagnosis not present

## 2022-04-04 DIAGNOSIS — E782 Mixed hyperlipidemia: Secondary | ICD-10-CM | POA: Diagnosis not present

## 2022-04-04 DIAGNOSIS — Z720 Tobacco use: Secondary | ICD-10-CM | POA: Diagnosis not present

## 2022-04-05 DIAGNOSIS — I482 Chronic atrial fibrillation, unspecified: Secondary | ICD-10-CM | POA: Diagnosis not present

## 2022-04-05 DIAGNOSIS — M25551 Pain in right hip: Secondary | ICD-10-CM | POA: Diagnosis not present

## 2022-04-05 DIAGNOSIS — Z139 Encounter for screening, unspecified: Secondary | ICD-10-CM | POA: Diagnosis not present

## 2022-04-05 DIAGNOSIS — Z6837 Body mass index (BMI) 37.0-37.9, adult: Secondary | ICD-10-CM | POA: Diagnosis not present

## 2022-04-05 DIAGNOSIS — M47816 Spondylosis without myelopathy or radiculopathy, lumbar region: Secondary | ICD-10-CM | POA: Diagnosis not present

## 2022-04-05 DIAGNOSIS — M1611 Unilateral primary osteoarthritis, right hip: Secondary | ICD-10-CM | POA: Diagnosis not present

## 2022-04-09 DIAGNOSIS — R7303 Prediabetes: Secondary | ICD-10-CM | POA: Diagnosis not present

## 2022-04-09 DIAGNOSIS — Z713 Dietary counseling and surveillance: Secondary | ICD-10-CM | POA: Diagnosis not present

## 2022-04-09 DIAGNOSIS — Z6837 Body mass index (BMI) 37.0-37.9, adult: Secondary | ICD-10-CM | POA: Diagnosis not present

## 2022-04-23 DIAGNOSIS — L820 Inflamed seborrheic keratosis: Secondary | ICD-10-CM | POA: Diagnosis not present

## 2022-04-30 DIAGNOSIS — Z713 Dietary counseling and surveillance: Secondary | ICD-10-CM | POA: Diagnosis not present

## 2022-04-30 DIAGNOSIS — Z6837 Body mass index (BMI) 37.0-37.9, adult: Secondary | ICD-10-CM | POA: Diagnosis not present

## 2022-04-30 DIAGNOSIS — R7303 Prediabetes: Secondary | ICD-10-CM | POA: Diagnosis not present

## 2022-05-03 DIAGNOSIS — Z7901 Long term (current) use of anticoagulants: Secondary | ICD-10-CM | POA: Diagnosis not present

## 2022-05-03 DIAGNOSIS — Z6837 Body mass index (BMI) 37.0-37.9, adult: Secondary | ICD-10-CM | POA: Diagnosis not present

## 2022-05-03 DIAGNOSIS — I482 Chronic atrial fibrillation, unspecified: Secondary | ICD-10-CM | POA: Diagnosis not present

## 2022-05-03 DIAGNOSIS — Z5181 Encounter for therapeutic drug level monitoring: Secondary | ICD-10-CM | POA: Diagnosis not present

## 2022-05-07 DIAGNOSIS — G4733 Obstructive sleep apnea (adult) (pediatric): Secondary | ICD-10-CM | POA: Diagnosis not present

## 2022-05-14 DIAGNOSIS — R7303 Prediabetes: Secondary | ICD-10-CM | POA: Diagnosis not present

## 2022-05-14 DIAGNOSIS — Z713 Dietary counseling and surveillance: Secondary | ICD-10-CM | POA: Diagnosis not present

## 2022-05-14 DIAGNOSIS — Z6837 Body mass index (BMI) 37.0-37.9, adult: Secondary | ICD-10-CM | POA: Diagnosis not present

## 2022-05-17 DIAGNOSIS — Z5181 Encounter for therapeutic drug level monitoring: Secondary | ICD-10-CM | POA: Diagnosis not present

## 2022-05-17 DIAGNOSIS — I482 Chronic atrial fibrillation, unspecified: Secondary | ICD-10-CM | POA: Diagnosis not present

## 2022-05-17 DIAGNOSIS — Z6836 Body mass index (BMI) 36.0-36.9, adult: Secondary | ICD-10-CM | POA: Diagnosis not present

## 2022-05-17 DIAGNOSIS — M1611 Unilateral primary osteoarthritis, right hip: Secondary | ICD-10-CM | POA: Diagnosis not present

## 2022-05-28 DIAGNOSIS — Z713 Dietary counseling and surveillance: Secondary | ICD-10-CM | POA: Diagnosis not present

## 2022-05-28 DIAGNOSIS — Z6837 Body mass index (BMI) 37.0-37.9, adult: Secondary | ICD-10-CM | POA: Diagnosis not present

## 2022-05-28 DIAGNOSIS — R7303 Prediabetes: Secondary | ICD-10-CM | POA: Diagnosis not present

## 2022-06-11 DIAGNOSIS — Z713 Dietary counseling and surveillance: Secondary | ICD-10-CM | POA: Diagnosis not present

## 2022-06-11 DIAGNOSIS — J449 Chronic obstructive pulmonary disease, unspecified: Secondary | ICD-10-CM | POA: Diagnosis not present

## 2022-06-11 DIAGNOSIS — I482 Chronic atrial fibrillation, unspecified: Secondary | ICD-10-CM | POA: Diagnosis not present

## 2022-06-11 DIAGNOSIS — Z6829 Body mass index (BMI) 29.0-29.9, adult: Secondary | ICD-10-CM | POA: Diagnosis not present

## 2022-06-21 DIAGNOSIS — Z23 Encounter for immunization: Secondary | ICD-10-CM | POA: Diagnosis not present

## 2022-06-21 DIAGNOSIS — I482 Chronic atrial fibrillation, unspecified: Secondary | ICD-10-CM | POA: Diagnosis not present

## 2022-06-21 DIAGNOSIS — Z6830 Body mass index (BMI) 30.0-30.9, adult: Secondary | ICD-10-CM | POA: Diagnosis not present

## 2022-06-21 DIAGNOSIS — Z713 Dietary counseling and surveillance: Secondary | ICD-10-CM | POA: Diagnosis not present

## 2022-06-21 DIAGNOSIS — I700 Atherosclerosis of aorta: Secondary | ICD-10-CM | POA: Diagnosis not present

## 2022-06-21 DIAGNOSIS — Z7901 Long term (current) use of anticoagulants: Secondary | ICD-10-CM | POA: Diagnosis not present

## 2022-06-25 DIAGNOSIS — E782 Mixed hyperlipidemia: Secondary | ICD-10-CM | POA: Diagnosis not present

## 2022-06-25 DIAGNOSIS — Z6837 Body mass index (BMI) 37.0-37.9, adult: Secondary | ICD-10-CM | POA: Diagnosis not present

## 2022-06-25 DIAGNOSIS — Z713 Dietary counseling and surveillance: Secondary | ICD-10-CM | POA: Diagnosis not present

## 2022-07-09 DIAGNOSIS — Z6837 Body mass index (BMI) 37.0-37.9, adult: Secondary | ICD-10-CM | POA: Diagnosis not present

## 2022-07-09 DIAGNOSIS — R7303 Prediabetes: Secondary | ICD-10-CM | POA: Diagnosis not present

## 2022-07-09 DIAGNOSIS — Z713 Dietary counseling and surveillance: Secondary | ICD-10-CM | POA: Diagnosis not present

## 2022-07-19 DIAGNOSIS — R7303 Prediabetes: Secondary | ICD-10-CM | POA: Diagnosis not present

## 2022-07-19 DIAGNOSIS — E782 Mixed hyperlipidemia: Secondary | ICD-10-CM | POA: Diagnosis not present

## 2022-07-26 DIAGNOSIS — I700 Atherosclerosis of aorta: Secondary | ICD-10-CM | POA: Diagnosis not present

## 2022-07-26 DIAGNOSIS — R7303 Prediabetes: Secondary | ICD-10-CM | POA: Diagnosis not present

## 2022-07-26 DIAGNOSIS — I482 Chronic atrial fibrillation, unspecified: Secondary | ICD-10-CM | POA: Diagnosis not present

## 2022-07-26 DIAGNOSIS — E782 Mixed hyperlipidemia: Secondary | ICD-10-CM | POA: Diagnosis not present

## 2022-07-26 DIAGNOSIS — Z6837 Body mass index (BMI) 37.0-37.9, adult: Secondary | ICD-10-CM | POA: Diagnosis not present

## 2022-07-30 DIAGNOSIS — G4733 Obstructive sleep apnea (adult) (pediatric): Secondary | ICD-10-CM | POA: Diagnosis not present

## 2022-08-06 DIAGNOSIS — Z6837 Body mass index (BMI) 37.0-37.9, adult: Secondary | ICD-10-CM | POA: Diagnosis not present

## 2022-08-06 DIAGNOSIS — R7303 Prediabetes: Secondary | ICD-10-CM | POA: Diagnosis not present

## 2022-08-06 DIAGNOSIS — Z713 Dietary counseling and surveillance: Secondary | ICD-10-CM | POA: Diagnosis not present

## 2022-08-29 DIAGNOSIS — G4733 Obstructive sleep apnea (adult) (pediatric): Secondary | ICD-10-CM | POA: Diagnosis not present

## 2022-08-30 DIAGNOSIS — Z7901 Long term (current) use of anticoagulants: Secondary | ICD-10-CM | POA: Diagnosis not present

## 2022-08-30 DIAGNOSIS — Z6837 Body mass index (BMI) 37.0-37.9, adult: Secondary | ICD-10-CM | POA: Diagnosis not present

## 2022-08-30 DIAGNOSIS — M25551 Pain in right hip: Secondary | ICD-10-CM | POA: Diagnosis not present

## 2022-08-30 DIAGNOSIS — I482 Chronic atrial fibrillation, unspecified: Secondary | ICD-10-CM | POA: Diagnosis not present

## 2022-08-30 DIAGNOSIS — D6869 Other thrombophilia: Secondary | ICD-10-CM | POA: Diagnosis not present

## 2022-09-03 DIAGNOSIS — Z713 Dietary counseling and surveillance: Secondary | ICD-10-CM | POA: Diagnosis not present

## 2022-09-03 DIAGNOSIS — Z6837 Body mass index (BMI) 37.0-37.9, adult: Secondary | ICD-10-CM | POA: Diagnosis not present

## 2022-09-03 DIAGNOSIS — R7303 Prediabetes: Secondary | ICD-10-CM | POA: Diagnosis not present

## 2022-09-05 DIAGNOSIS — K5010 Crohn's disease of large intestine without complications: Secondary | ICD-10-CM | POA: Diagnosis not present

## 2022-09-10 DIAGNOSIS — Z96651 Presence of right artificial knee joint: Secondary | ICD-10-CM | POA: Diagnosis not present

## 2022-09-10 DIAGNOSIS — M25562 Pain in left knee: Secondary | ICD-10-CM | POA: Diagnosis not present

## 2022-09-10 DIAGNOSIS — G8929 Other chronic pain: Secondary | ICD-10-CM | POA: Diagnosis not present

## 2022-09-11 DIAGNOSIS — K5010 Crohn's disease of large intestine without complications: Secondary | ICD-10-CM | POA: Diagnosis not present

## 2022-10-04 DIAGNOSIS — R609 Edema, unspecified: Secondary | ICD-10-CM | POA: Diagnosis not present

## 2022-10-04 DIAGNOSIS — Z139 Encounter for screening, unspecified: Secondary | ICD-10-CM | POA: Diagnosis not present

## 2022-10-04 DIAGNOSIS — I482 Chronic atrial fibrillation, unspecified: Secondary | ICD-10-CM | POA: Diagnosis not present

## 2022-10-04 DIAGNOSIS — Z7901 Long term (current) use of anticoagulants: Secondary | ICD-10-CM | POA: Diagnosis not present

## 2022-10-04 DIAGNOSIS — Z6837 Body mass index (BMI) 37.0-37.9, adult: Secondary | ICD-10-CM | POA: Diagnosis not present

## 2022-10-04 DIAGNOSIS — M1611 Unilateral primary osteoarthritis, right hip: Secondary | ICD-10-CM | POA: Diagnosis not present

## 2022-10-23 DIAGNOSIS — G4733 Obstructive sleep apnea (adult) (pediatric): Secondary | ICD-10-CM | POA: Diagnosis not present

## 2022-11-08 DIAGNOSIS — Z6837 Body mass index (BMI) 37.0-37.9, adult: Secondary | ICD-10-CM | POA: Diagnosis not present

## 2022-11-08 DIAGNOSIS — Z5181 Encounter for therapeutic drug level monitoring: Secondary | ICD-10-CM | POA: Diagnosis not present

## 2022-11-08 DIAGNOSIS — I482 Chronic atrial fibrillation, unspecified: Secondary | ICD-10-CM | POA: Diagnosis not present

## 2022-11-08 DIAGNOSIS — N529 Male erectile dysfunction, unspecified: Secondary | ICD-10-CM | POA: Diagnosis not present

## 2022-11-08 DIAGNOSIS — Z7901 Long term (current) use of anticoagulants: Secondary | ICD-10-CM | POA: Diagnosis not present

## 2022-12-03 DIAGNOSIS — L578 Other skin changes due to chronic exposure to nonionizing radiation: Secondary | ICD-10-CM | POA: Diagnosis not present

## 2022-12-03 DIAGNOSIS — L820 Inflamed seborrheic keratosis: Secondary | ICD-10-CM | POA: Diagnosis not present

## 2022-12-03 DIAGNOSIS — L814 Other melanin hyperpigmentation: Secondary | ICD-10-CM | POA: Diagnosis not present

## 2022-12-03 DIAGNOSIS — D225 Melanocytic nevi of trunk: Secondary | ICD-10-CM | POA: Diagnosis not present

## 2022-12-09 DIAGNOSIS — Z1331 Encounter for screening for depression: Secondary | ICD-10-CM | POA: Diagnosis not present

## 2022-12-09 DIAGNOSIS — I482 Chronic atrial fibrillation, unspecified: Secondary | ICD-10-CM | POA: Diagnosis not present

## 2022-12-09 DIAGNOSIS — Z139 Encounter for screening, unspecified: Secondary | ICD-10-CM | POA: Diagnosis not present

## 2022-12-09 DIAGNOSIS — Z0000 Encounter for general adult medical examination without abnormal findings: Secondary | ICD-10-CM | POA: Diagnosis not present

## 2022-12-09 DIAGNOSIS — Z1389 Encounter for screening for other disorder: Secondary | ICD-10-CM | POA: Diagnosis not present

## 2022-12-09 DIAGNOSIS — I700 Atherosclerosis of aorta: Secondary | ICD-10-CM | POA: Diagnosis not present

## 2022-12-09 DIAGNOSIS — Z1339 Encounter for screening examination for other mental health and behavioral disorders: Secondary | ICD-10-CM | POA: Diagnosis not present

## 2022-12-09 DIAGNOSIS — Z23 Encounter for immunization: Secondary | ICD-10-CM | POA: Diagnosis not present

## 2022-12-09 DIAGNOSIS — I2510 Atherosclerotic heart disease of native coronary artery without angina pectoris: Secondary | ICD-10-CM | POA: Diagnosis not present

## 2022-12-20 DIAGNOSIS — Z9181 History of falling: Secondary | ICD-10-CM | POA: Diagnosis not present

## 2022-12-20 DIAGNOSIS — Z6837 Body mass index (BMI) 37.0-37.9, adult: Secondary | ICD-10-CM | POA: Diagnosis not present

## 2022-12-20 DIAGNOSIS — I482 Chronic atrial fibrillation, unspecified: Secondary | ICD-10-CM | POA: Diagnosis not present

## 2022-12-24 DIAGNOSIS — Z961 Presence of intraocular lens: Secondary | ICD-10-CM | POA: Diagnosis not present

## 2022-12-24 DIAGNOSIS — H524 Presbyopia: Secondary | ICD-10-CM | POA: Diagnosis not present

## 2022-12-24 DIAGNOSIS — H52223 Regular astigmatism, bilateral: Secondary | ICD-10-CM | POA: Diagnosis not present

## 2022-12-24 DIAGNOSIS — Z9849 Cataract extraction status, unspecified eye: Secondary | ICD-10-CM | POA: Diagnosis not present

## 2022-12-24 DIAGNOSIS — Z0100 Encounter for examination of eyes and vision without abnormal findings: Secondary | ICD-10-CM | POA: Diagnosis not present

## 2023-01-17 DIAGNOSIS — Z6837 Body mass index (BMI) 37.0-37.9, adult: Secondary | ICD-10-CM | POA: Diagnosis not present

## 2023-01-17 DIAGNOSIS — Z5181 Encounter for therapeutic drug level monitoring: Secondary | ICD-10-CM | POA: Diagnosis not present

## 2023-01-17 DIAGNOSIS — Z23 Encounter for immunization: Secondary | ICD-10-CM | POA: Diagnosis not present

## 2023-01-17 DIAGNOSIS — Z7901 Long term (current) use of anticoagulants: Secondary | ICD-10-CM | POA: Diagnosis not present

## 2023-01-17 DIAGNOSIS — I482 Chronic atrial fibrillation, unspecified: Secondary | ICD-10-CM | POA: Diagnosis not present

## 2023-01-17 DIAGNOSIS — G4733 Obstructive sleep apnea (adult) (pediatric): Secondary | ICD-10-CM | POA: Diagnosis not present

## 2023-01-17 DIAGNOSIS — Z1331 Encounter for screening for depression: Secondary | ICD-10-CM | POA: Diagnosis not present

## 2023-02-17 DIAGNOSIS — J432 Centrilobular emphysema: Secondary | ICD-10-CM | POA: Diagnosis not present

## 2023-02-17 DIAGNOSIS — Z7901 Long term (current) use of anticoagulants: Secondary | ICD-10-CM | POA: Diagnosis not present

## 2023-02-17 DIAGNOSIS — I4891 Unspecified atrial fibrillation: Secondary | ICD-10-CM | POA: Diagnosis not present

## 2023-02-17 DIAGNOSIS — F17210 Nicotine dependence, cigarettes, uncomplicated: Secondary | ICD-10-CM | POA: Diagnosis not present

## 2023-02-17 DIAGNOSIS — K1379 Other lesions of oral mucosa: Secondary | ICD-10-CM | POA: Diagnosis not present

## 2023-02-17 DIAGNOSIS — K5730 Diverticulosis of large intestine without perforation or abscess without bleeding: Secondary | ICD-10-CM | POA: Diagnosis not present

## 2023-02-17 DIAGNOSIS — I7143 Infrarenal abdominal aortic aneurysm, without rupture: Secondary | ICD-10-CM | POA: Diagnosis not present

## 2023-02-17 DIAGNOSIS — I2510 Atherosclerotic heart disease of native coronary artery without angina pectoris: Secondary | ICD-10-CM | POA: Diagnosis not present

## 2023-02-17 DIAGNOSIS — R042 Hemoptysis: Secondary | ICD-10-CM | POA: Diagnosis not present

## 2023-02-17 DIAGNOSIS — I714 Abdominal aortic aneurysm, without rupture, unspecified: Secondary | ICD-10-CM | POA: Diagnosis not present

## 2023-02-17 DIAGNOSIS — N281 Cyst of kidney, acquired: Secondary | ICD-10-CM | POA: Diagnosis not present

## 2023-02-17 DIAGNOSIS — I700 Atherosclerosis of aorta: Secondary | ICD-10-CM | POA: Diagnosis not present

## 2023-02-24 DIAGNOSIS — K1379 Other lesions of oral mucosa: Secondary | ICD-10-CM | POA: Diagnosis not present

## 2023-02-24 DIAGNOSIS — Z6837 Body mass index (BMI) 37.0-37.9, adult: Secondary | ICD-10-CM | POA: Diagnosis not present

## 2023-02-24 DIAGNOSIS — I482 Chronic atrial fibrillation, unspecified: Secondary | ICD-10-CM | POA: Diagnosis not present

## 2023-02-24 DIAGNOSIS — Z5181 Encounter for therapeutic drug level monitoring: Secondary | ICD-10-CM | POA: Diagnosis not present

## 2023-02-24 DIAGNOSIS — D6869 Other thrombophilia: Secondary | ICD-10-CM | POA: Diagnosis not present

## 2023-02-24 DIAGNOSIS — Z7901 Long term (current) use of anticoagulants: Secondary | ICD-10-CM | POA: Diagnosis not present

## 2023-03-28 DIAGNOSIS — I482 Chronic atrial fibrillation, unspecified: Secondary | ICD-10-CM | POA: Diagnosis not present

## 2023-03-28 DIAGNOSIS — Z7901 Long term (current) use of anticoagulants: Secondary | ICD-10-CM | POA: Diagnosis not present

## 2023-03-28 DIAGNOSIS — Z6838 Body mass index (BMI) 38.0-38.9, adult: Secondary | ICD-10-CM | POA: Diagnosis not present

## 2023-03-28 DIAGNOSIS — Z5181 Encounter for therapeutic drug level monitoring: Secondary | ICD-10-CM | POA: Diagnosis not present

## 2023-04-08 DIAGNOSIS — Z125 Encounter for screening for malignant neoplasm of prostate: Secondary | ICD-10-CM | POA: Diagnosis not present

## 2023-04-08 DIAGNOSIS — E782 Mixed hyperlipidemia: Secondary | ICD-10-CM | POA: Diagnosis not present

## 2023-04-08 DIAGNOSIS — R7303 Prediabetes: Secondary | ICD-10-CM | POA: Diagnosis not present

## 2023-04-11 DIAGNOSIS — R7303 Prediabetes: Secondary | ICD-10-CM | POA: Diagnosis not present

## 2023-04-11 DIAGNOSIS — I482 Chronic atrial fibrillation, unspecified: Secondary | ICD-10-CM | POA: Diagnosis not present

## 2023-04-11 DIAGNOSIS — Z5181 Encounter for therapeutic drug level monitoring: Secondary | ICD-10-CM | POA: Diagnosis not present

## 2023-04-11 DIAGNOSIS — J449 Chronic obstructive pulmonary disease, unspecified: Secondary | ICD-10-CM | POA: Diagnosis not present

## 2023-04-11 DIAGNOSIS — I700 Atherosclerosis of aorta: Secondary | ICD-10-CM | POA: Diagnosis not present

## 2023-04-11 DIAGNOSIS — Z6837 Body mass index (BMI) 37.0-37.9, adult: Secondary | ICD-10-CM | POA: Diagnosis not present

## 2023-04-14 DIAGNOSIS — G4733 Obstructive sleep apnea (adult) (pediatric): Secondary | ICD-10-CM | POA: Diagnosis not present

## 2023-05-08 DIAGNOSIS — I7143 Infrarenal abdominal aortic aneurysm, without rupture: Secondary | ICD-10-CM | POA: Diagnosis not present

## 2023-05-08 DIAGNOSIS — I7789 Other specified disorders of arteries and arterioles: Secondary | ICD-10-CM | POA: Diagnosis not present

## 2023-05-08 DIAGNOSIS — I723 Aneurysm of iliac artery: Secondary | ICD-10-CM | POA: Diagnosis not present

## 2023-05-08 DIAGNOSIS — F17210 Nicotine dependence, cigarettes, uncomplicated: Secondary | ICD-10-CM | POA: Diagnosis not present

## 2023-05-08 DIAGNOSIS — E782 Mixed hyperlipidemia: Secondary | ICD-10-CM | POA: Diagnosis not present

## 2023-05-16 DIAGNOSIS — I482 Chronic atrial fibrillation, unspecified: Secondary | ICD-10-CM | POA: Diagnosis not present

## 2023-05-16 DIAGNOSIS — Z7901 Long term (current) use of anticoagulants: Secondary | ICD-10-CM | POA: Diagnosis not present

## 2023-05-16 DIAGNOSIS — Z6837 Body mass index (BMI) 37.0-37.9, adult: Secondary | ICD-10-CM | POA: Diagnosis not present

## 2023-05-16 DIAGNOSIS — D6869 Other thrombophilia: Secondary | ICD-10-CM | POA: Diagnosis not present

## 2023-05-16 DIAGNOSIS — Z5181 Encounter for therapeutic drug level monitoring: Secondary | ICD-10-CM | POA: Diagnosis not present

## 2023-06-03 DIAGNOSIS — L820 Inflamed seborrheic keratosis: Secondary | ICD-10-CM | POA: Diagnosis not present

## 2023-06-13 DIAGNOSIS — I482 Chronic atrial fibrillation, unspecified: Secondary | ICD-10-CM | POA: Diagnosis not present

## 2023-06-13 DIAGNOSIS — D6869 Other thrombophilia: Secondary | ICD-10-CM | POA: Diagnosis not present

## 2023-06-13 DIAGNOSIS — Z7901 Long term (current) use of anticoagulants: Secondary | ICD-10-CM | POA: Diagnosis not present

## 2023-06-13 DIAGNOSIS — Z5181 Encounter for therapeutic drug level monitoring: Secondary | ICD-10-CM | POA: Diagnosis not present

## 2023-06-13 DIAGNOSIS — Z6837 Body mass index (BMI) 37.0-37.9, adult: Secondary | ICD-10-CM | POA: Diagnosis not present

## 2023-07-18 DIAGNOSIS — I482 Chronic atrial fibrillation, unspecified: Secondary | ICD-10-CM | POA: Diagnosis not present

## 2023-07-18 DIAGNOSIS — F419 Anxiety disorder, unspecified: Secondary | ICD-10-CM | POA: Diagnosis not present

## 2023-07-18 DIAGNOSIS — Z6838 Body mass index (BMI) 38.0-38.9, adult: Secondary | ICD-10-CM | POA: Diagnosis not present

## 2023-07-18 DIAGNOSIS — Z5181 Encounter for therapeutic drug level monitoring: Secondary | ICD-10-CM | POA: Diagnosis not present

## 2023-07-18 DIAGNOSIS — F32A Depression, unspecified: Secondary | ICD-10-CM | POA: Diagnosis not present

## 2023-07-18 DIAGNOSIS — Z9181 History of falling: Secondary | ICD-10-CM | POA: Diagnosis not present

## 2023-08-13 DIAGNOSIS — G4733 Obstructive sleep apnea (adult) (pediatric): Secondary | ICD-10-CM | POA: Diagnosis not present

## 2023-08-22 DIAGNOSIS — Z5181 Encounter for therapeutic drug level monitoring: Secondary | ICD-10-CM | POA: Diagnosis not present

## 2023-08-22 DIAGNOSIS — I482 Chronic atrial fibrillation, unspecified: Secondary | ICD-10-CM | POA: Diagnosis not present

## 2023-08-22 DIAGNOSIS — Z6838 Body mass index (BMI) 38.0-38.9, adult: Secondary | ICD-10-CM | POA: Diagnosis not present

## 2023-08-22 DIAGNOSIS — Z7901 Long term (current) use of anticoagulants: Secondary | ICD-10-CM | POA: Diagnosis not present

## 2023-09-12 DIAGNOSIS — G4733 Obstructive sleep apnea (adult) (pediatric): Secondary | ICD-10-CM | POA: Diagnosis not present

## 2023-09-19 DIAGNOSIS — I951 Orthostatic hypotension: Secondary | ICD-10-CM | POA: Diagnosis not present

## 2023-09-19 DIAGNOSIS — Z6838 Body mass index (BMI) 38.0-38.9, adult: Secondary | ICD-10-CM | POA: Diagnosis not present

## 2023-09-19 DIAGNOSIS — Z7901 Long term (current) use of anticoagulants: Secondary | ICD-10-CM | POA: Diagnosis not present

## 2023-09-19 DIAGNOSIS — Z139 Encounter for screening, unspecified: Secondary | ICD-10-CM | POA: Diagnosis not present

## 2023-09-19 DIAGNOSIS — I482 Chronic atrial fibrillation, unspecified: Secondary | ICD-10-CM | POA: Diagnosis not present

## 2023-09-19 DIAGNOSIS — Z5181 Encounter for therapeutic drug level monitoring: Secondary | ICD-10-CM | POA: Diagnosis not present

## 2023-10-13 DIAGNOSIS — G4733 Obstructive sleep apnea (adult) (pediatric): Secondary | ICD-10-CM | POA: Diagnosis not present

## 2023-10-17 DIAGNOSIS — D6869 Other thrombophilia: Secondary | ICD-10-CM | POA: Diagnosis not present

## 2023-10-17 DIAGNOSIS — Z5181 Encounter for therapeutic drug level monitoring: Secondary | ICD-10-CM | POA: Diagnosis not present

## 2023-10-17 DIAGNOSIS — I482 Chronic atrial fibrillation, unspecified: Secondary | ICD-10-CM | POA: Diagnosis not present

## 2023-10-17 DIAGNOSIS — Z6838 Body mass index (BMI) 38.0-38.9, adult: Secondary | ICD-10-CM | POA: Diagnosis not present

## 2023-10-17 DIAGNOSIS — Z7901 Long term (current) use of anticoagulants: Secondary | ICD-10-CM | POA: Diagnosis not present

## 2023-10-23 DIAGNOSIS — M25562 Pain in left knee: Secondary | ICD-10-CM | POA: Diagnosis not present

## 2023-10-23 DIAGNOSIS — G8929 Other chronic pain: Secondary | ICD-10-CM | POA: Diagnosis not present

## 2023-11-17 DIAGNOSIS — Z860100 Personal history of colon polyps, unspecified: Secondary | ICD-10-CM | POA: Diagnosis not present

## 2023-11-17 DIAGNOSIS — K5010 Crohn's disease of large intestine without complications: Secondary | ICD-10-CM | POA: Diagnosis not present

## 2023-11-19 DIAGNOSIS — G4733 Obstructive sleep apnea (adult) (pediatric): Secondary | ICD-10-CM | POA: Diagnosis not present

## 2023-11-21 DIAGNOSIS — I482 Chronic atrial fibrillation, unspecified: Secondary | ICD-10-CM | POA: Diagnosis not present

## 2023-11-21 DIAGNOSIS — Z6838 Body mass index (BMI) 38.0-38.9, adult: Secondary | ICD-10-CM | POA: Diagnosis not present

## 2023-11-21 DIAGNOSIS — R7303 Prediabetes: Secondary | ICD-10-CM | POA: Diagnosis not present

## 2023-11-21 DIAGNOSIS — Z5181 Encounter for therapeutic drug level monitoring: Secondary | ICD-10-CM | POA: Diagnosis not present

## 2023-11-21 DIAGNOSIS — Z139 Encounter for screening, unspecified: Secondary | ICD-10-CM | POA: Diagnosis not present

## 2023-11-21 DIAGNOSIS — E782 Mixed hyperlipidemia: Secondary | ICD-10-CM | POA: Diagnosis not present

## 2023-11-21 DIAGNOSIS — Z7901 Long term (current) use of anticoagulants: Secondary | ICD-10-CM | POA: Diagnosis not present

## 2023-11-21 DIAGNOSIS — Z6839 Body mass index (BMI) 39.0-39.9, adult: Secondary | ICD-10-CM | POA: Diagnosis not present

## 2023-12-05 DIAGNOSIS — Z1389 Encounter for screening for other disorder: Secondary | ICD-10-CM | POA: Diagnosis not present

## 2023-12-05 DIAGNOSIS — Z1331 Encounter for screening for depression: Secondary | ICD-10-CM | POA: Diagnosis not present

## 2023-12-05 DIAGNOSIS — Z6838 Body mass index (BMI) 38.0-38.9, adult: Secondary | ICD-10-CM | POA: Diagnosis not present

## 2023-12-05 DIAGNOSIS — Z0000 Encounter for general adult medical examination without abnormal findings: Secondary | ICD-10-CM | POA: Diagnosis not present

## 2023-12-05 DIAGNOSIS — Z1339 Encounter for screening examination for other mental health and behavioral disorders: Secondary | ICD-10-CM | POA: Diagnosis not present

## 2023-12-05 DIAGNOSIS — Z139 Encounter for screening, unspecified: Secondary | ICD-10-CM | POA: Diagnosis not present

## 2023-12-05 DIAGNOSIS — R7303 Prediabetes: Secondary | ICD-10-CM | POA: Diagnosis not present

## 2023-12-05 DIAGNOSIS — I482 Chronic atrial fibrillation, unspecified: Secondary | ICD-10-CM | POA: Diagnosis not present

## 2023-12-05 DIAGNOSIS — J449 Chronic obstructive pulmonary disease, unspecified: Secondary | ICD-10-CM | POA: Diagnosis not present

## 2023-12-05 DIAGNOSIS — Z136 Encounter for screening for cardiovascular disorders: Secondary | ICD-10-CM | POA: Diagnosis not present

## 2023-12-05 DIAGNOSIS — E782 Mixed hyperlipidemia: Secondary | ICD-10-CM | POA: Diagnosis not present

## 2023-12-08 DIAGNOSIS — L820 Inflamed seborrheic keratosis: Secondary | ICD-10-CM | POA: Diagnosis not present

## 2023-12-19 DIAGNOSIS — G4733 Obstructive sleep apnea (adult) (pediatric): Secondary | ICD-10-CM | POA: Diagnosis not present

## 2024-01-09 DIAGNOSIS — I482 Chronic atrial fibrillation, unspecified: Secondary | ICD-10-CM | POA: Diagnosis not present

## 2024-01-09 DIAGNOSIS — J449 Chronic obstructive pulmonary disease, unspecified: Secondary | ICD-10-CM | POA: Diagnosis not present

## 2024-01-09 DIAGNOSIS — Z7901 Long term (current) use of anticoagulants: Secondary | ICD-10-CM | POA: Diagnosis not present

## 2024-01-09 DIAGNOSIS — Z6839 Body mass index (BMI) 39.0-39.9, adult: Secondary | ICD-10-CM | POA: Diagnosis not present

## 2024-01-29 DIAGNOSIS — M1712 Unilateral primary osteoarthritis, left knee: Secondary | ICD-10-CM | POA: Diagnosis not present

## 2024-01-29 DIAGNOSIS — M25562 Pain in left knee: Secondary | ICD-10-CM | POA: Diagnosis not present

## 2024-01-29 DIAGNOSIS — G8929 Other chronic pain: Secondary | ICD-10-CM | POA: Diagnosis not present

## 2024-02-13 DIAGNOSIS — Z6838 Body mass index (BMI) 38.0-38.9, adult: Secondary | ICD-10-CM | POA: Diagnosis not present

## 2024-02-13 DIAGNOSIS — D6869 Other thrombophilia: Secondary | ICD-10-CM | POA: Diagnosis not present

## 2024-02-13 DIAGNOSIS — Z1331 Encounter for screening for depression: Secondary | ICD-10-CM | POA: Diagnosis not present

## 2024-02-13 DIAGNOSIS — Z23 Encounter for immunization: Secondary | ICD-10-CM | POA: Diagnosis not present

## 2024-02-13 DIAGNOSIS — Z5181 Encounter for therapeutic drug level monitoring: Secondary | ICD-10-CM | POA: Diagnosis not present

## 2024-02-13 DIAGNOSIS — Z7901 Long term (current) use of anticoagulants: Secondary | ICD-10-CM | POA: Diagnosis not present

## 2024-02-13 DIAGNOSIS — I482 Chronic atrial fibrillation, unspecified: Secondary | ICD-10-CM | POA: Diagnosis not present
